# Patient Record
Sex: Female | Born: 1980
Health system: Southern US, Community
[De-identification: ages and names within clinical notes are randomized; demographics above are authoritative.]

## PROBLEM LIST (undated history)

## (undated) DIAGNOSIS — N200 Calculus of kidney: Secondary | ICD-10-CM

## (undated) DIAGNOSIS — I10 Essential (primary) hypertension: Secondary | ICD-10-CM

## (undated) HISTORY — PX: TUBAL LIGATION: SHX77

---

## 2003-11-14 ENCOUNTER — Emergency Department (HOSPITAL_COMMUNITY): Admission: EM | Admit: 2003-11-14 | Discharge: 2003-11-14 | Payer: Self-pay | Admitting: Emergency Medicine

## 2004-07-04 ENCOUNTER — Emergency Department (HOSPITAL_COMMUNITY): Admission: EM | Admit: 2004-07-04 | Discharge: 2004-07-04 | Payer: Self-pay | Admitting: Emergency Medicine

## 2004-07-08 ENCOUNTER — Emergency Department (HOSPITAL_COMMUNITY): Admission: EM | Admit: 2004-07-08 | Discharge: 2004-07-09 | Payer: Self-pay | Admitting: Emergency Medicine

## 2004-07-10 ENCOUNTER — Inpatient Hospital Stay (HOSPITAL_COMMUNITY): Admission: AD | Admit: 2004-07-10 | Discharge: 2004-07-10 | Payer: Self-pay | Admitting: *Deleted

## 2004-07-20 ENCOUNTER — Emergency Department (HOSPITAL_COMMUNITY): Admission: EM | Admit: 2004-07-20 | Discharge: 2004-07-20 | Payer: Self-pay | Admitting: Emergency Medicine

## 2004-08-21 ENCOUNTER — Inpatient Hospital Stay (HOSPITAL_COMMUNITY): Admission: AD | Admit: 2004-08-21 | Discharge: 2004-08-21 | Payer: Self-pay | Admitting: Obstetrics & Gynecology

## 2004-10-14 ENCOUNTER — Other Ambulatory Visit: Admission: RE | Admit: 2004-10-14 | Discharge: 2004-10-14 | Payer: Self-pay | Admitting: Obstetrics and Gynecology

## 2005-01-24 ENCOUNTER — Inpatient Hospital Stay (HOSPITAL_COMMUNITY): Admission: AD | Admit: 2005-01-24 | Discharge: 2005-01-24 | Payer: Self-pay | Admitting: Obstetrics and Gynecology

## 2005-03-04 ENCOUNTER — Inpatient Hospital Stay (HOSPITAL_COMMUNITY): Admission: AD | Admit: 2005-03-04 | Discharge: 2005-03-07 | Payer: Self-pay | Admitting: Obstetrics and Gynecology

## 2005-04-14 ENCOUNTER — Ambulatory Visit (HOSPITAL_COMMUNITY): Admission: RE | Admit: 2005-04-14 | Discharge: 2005-04-14 | Payer: Self-pay | Admitting: Obstetrics & Gynecology

## 2006-03-14 ENCOUNTER — Emergency Department (HOSPITAL_COMMUNITY): Admission: EM | Admit: 2006-03-14 | Discharge: 2006-03-14 | Payer: Self-pay | Admitting: Emergency Medicine

## 2006-10-30 ENCOUNTER — Emergency Department (HOSPITAL_COMMUNITY): Admission: EM | Admit: 2006-10-30 | Discharge: 2006-10-30 | Payer: Self-pay | Admitting: Emergency Medicine

## 2007-02-17 IMAGING — US US OB COMP LESS 14 WK
1 series · 13 of 28 positions shown · non-contrast
Comparison: 07/04/04.

CLINICAL DATA: Early pregnancy.  
 TRANSABDOMINAL AND TRANSVAGINAL FIRST TRIMESTER OB ULTRASOUND, 07/08/04:

[Series 1: unknown · 0.25mm/px · 13 of 90 slices shown]
[im 4/90]
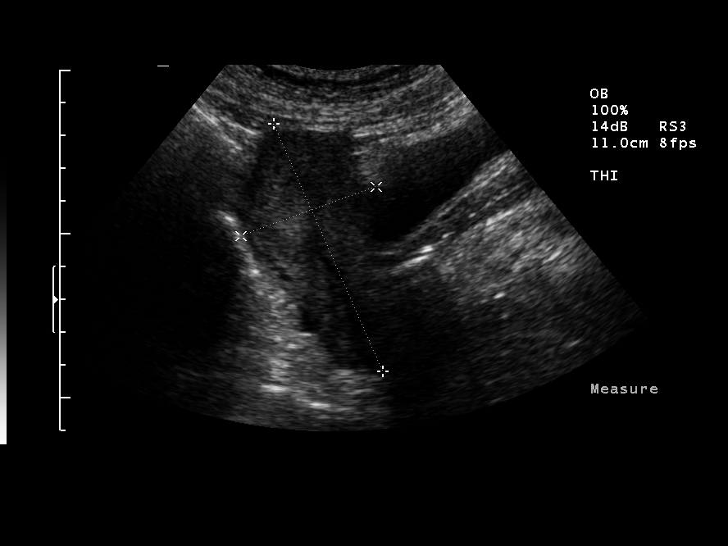
[im 10/90]
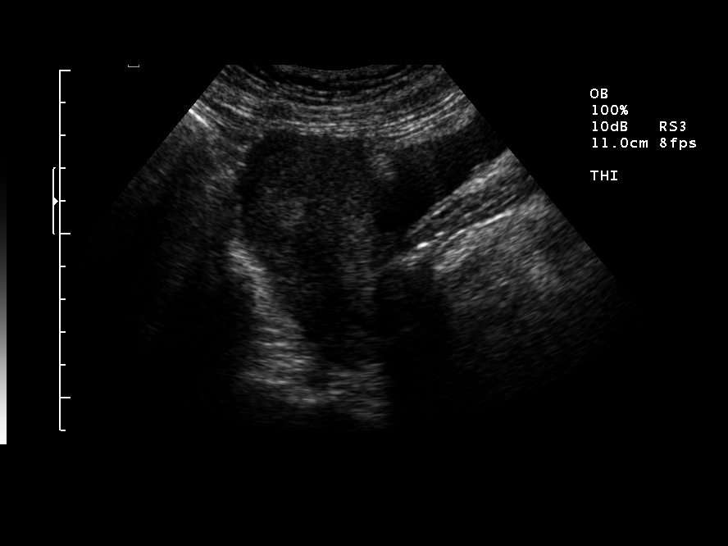
[im 17/90]
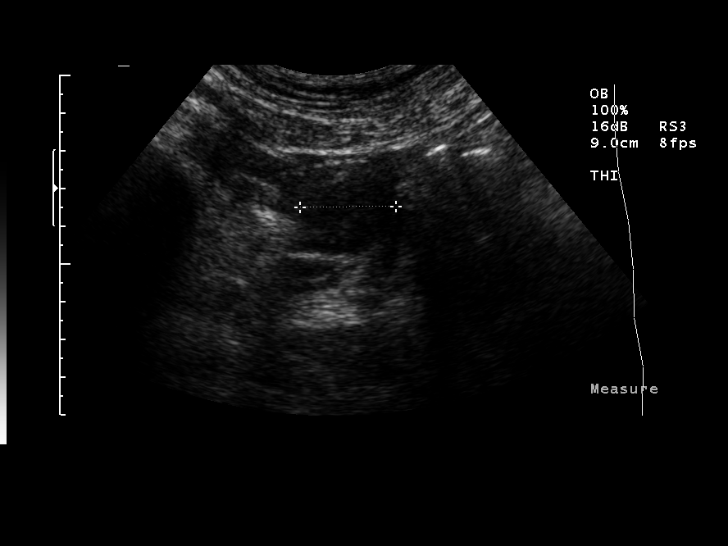
[im 24/90]
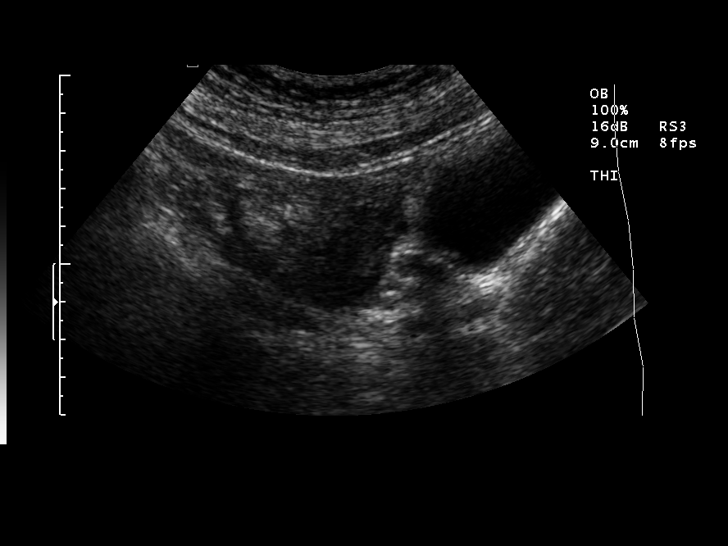
[im 30/90]
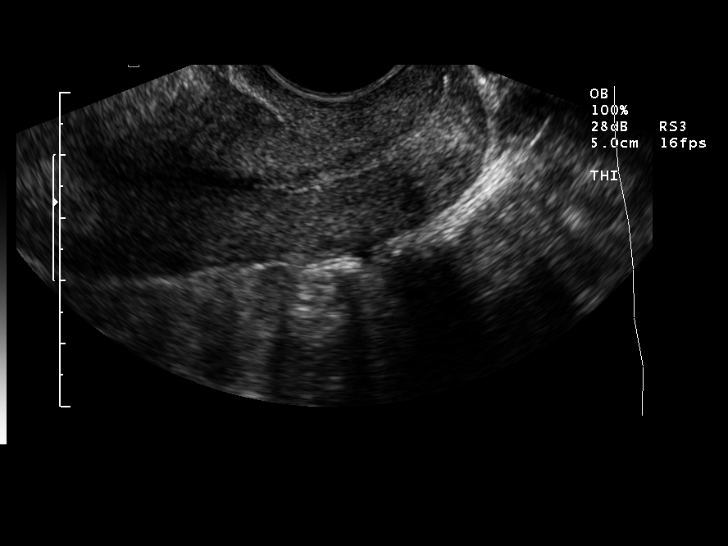
[im 37/90]
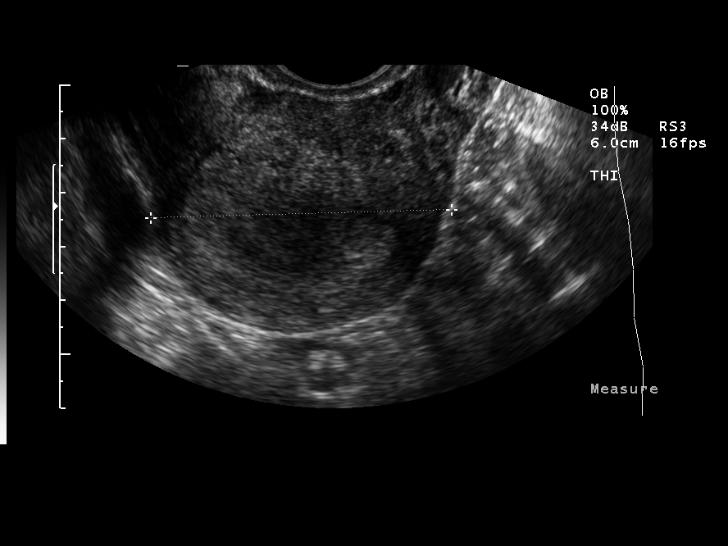
[im 47/90]
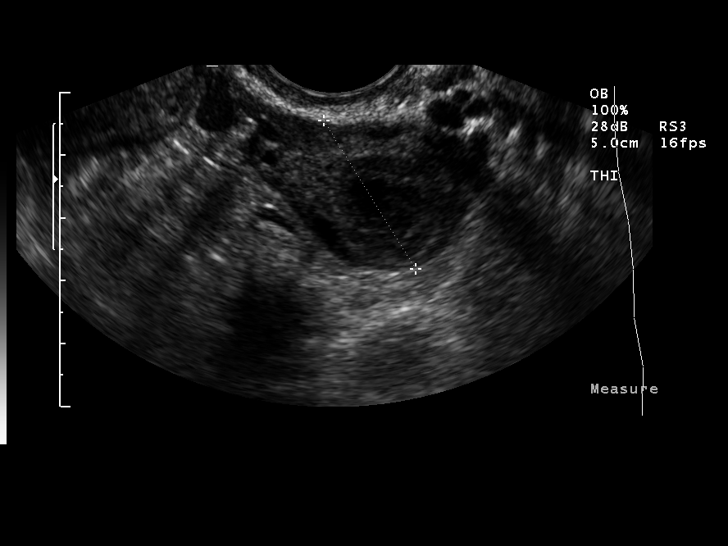
[im 53/90]
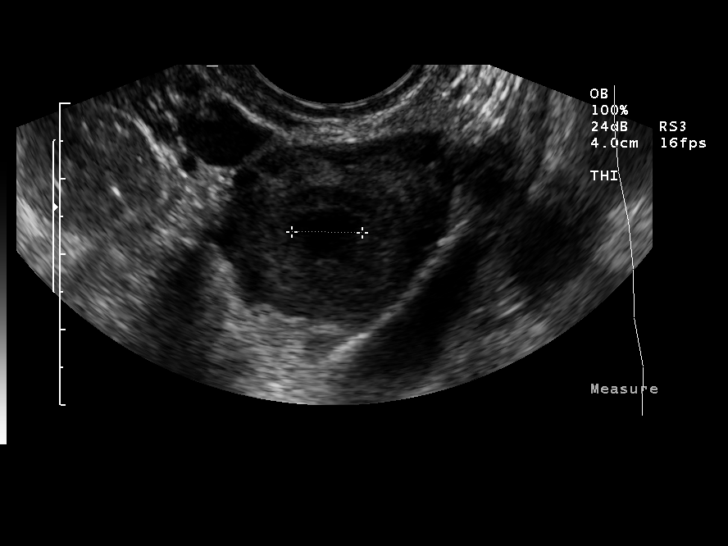
[im 60/90]
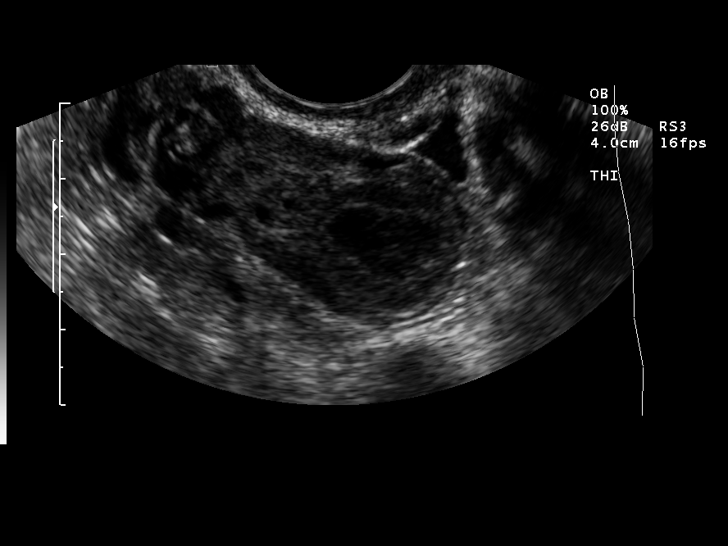
[im 66/90]
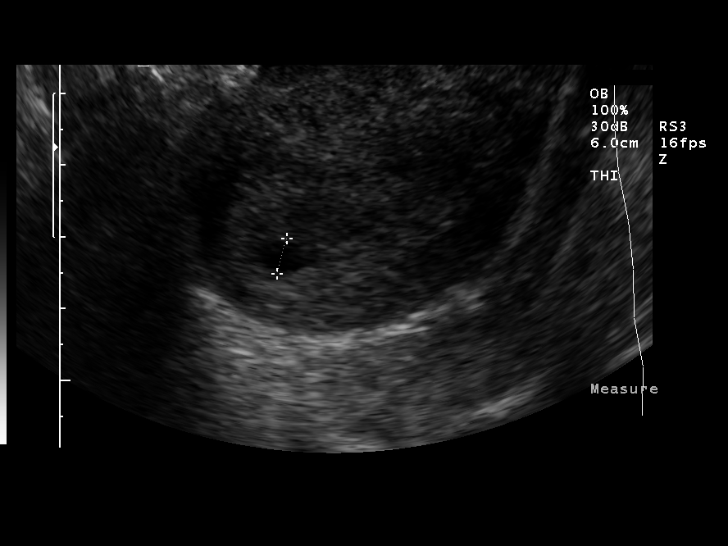
[im 73/90]
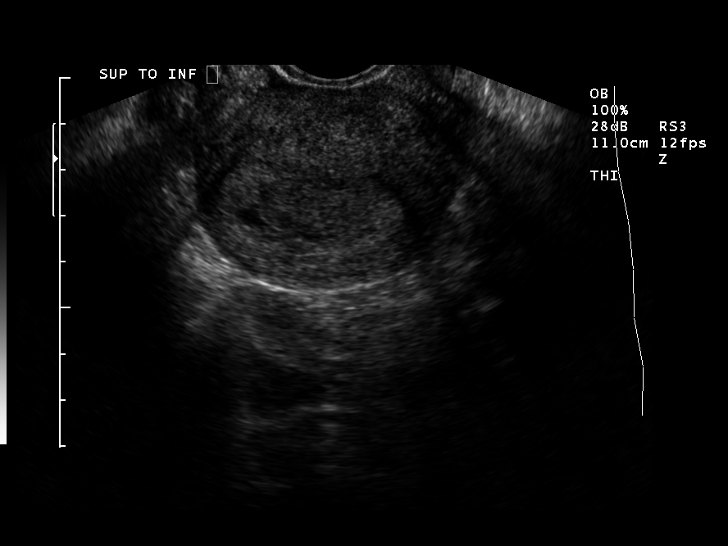
[im 80/90]
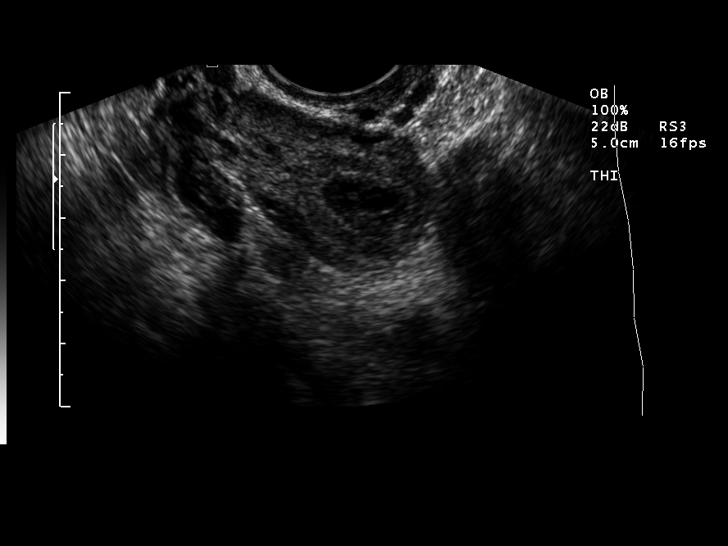
[im 86/90]
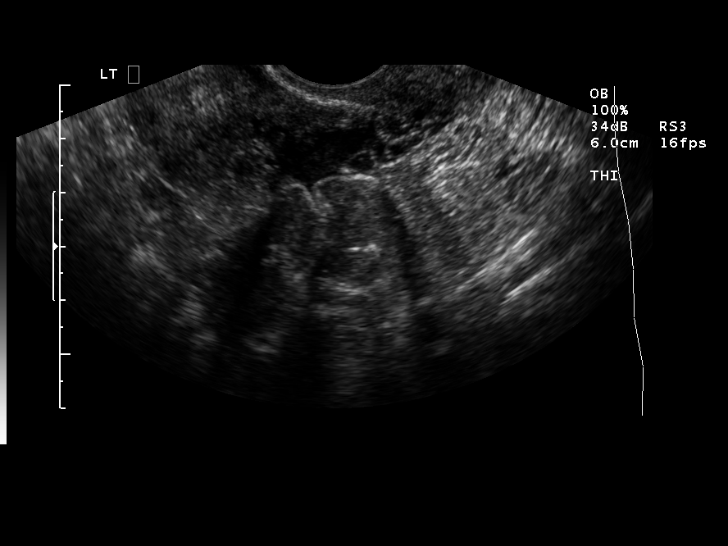

[13 of 28 positions shown; findings below may reference images not displayed]

FINDINGS: Uterine length is 8.2 cm.  There is a small amount of free pelvic fluid. 
 A cystic region in the left ovary measures 1.3 x 0.8 x 1.2 cm.  
 There appears to be a double decidual reaction in the uterus.  There is also a small sac in the distal uterus with mean sac diameter of 5 mm, consistent with 5 weeks gestation if this indeed represents a gestational sac.  No yolk sac or embryo is evident.  No fetal cardiac activity is yet evident.  No subchorionic hemorrhage is noted.  
 The right ovary appears normal. 
 No typical findings of ectopic pregnancy are identified.  The left-sided complex cystic lesion likely represents a corpus luteum cyst.
IMPRESSION: There is a small sac on today's examination, which could represent an early gestational sac.  No yolk sac or embryo is yet identified.  Differential diagnostic considerations include early pregnancy, anembryonic pregnancy, or pseudogestational sac in a setting of otherwise occult ectopic pregnancy.  Recommend careful correlation with beta hCG trend and a low threshold for reimaging.

## 2007-02-19 IMAGING — US US OB TRANSVAGINAL
1 series · 18 of 22 positions shown · non-contrast
Comparison: none

CLINICAL DATA: 5 week 5 day gestational age by LMP.  Quantitative beta HCG level of 7122.  Question early gestational sac versus pseudosac on prior ultrasound.  Evaluate pregnancy progression and rule out ectopic pregnancy.
 TRANSVAGINAL OBSTETRICAL ULTRASOUND:
 Transvaginal sonography was performed and comparison made to the prior study performed at [HOSPITAL] on 07/08/04.  
 An intrauterine gestational sac is seen on today?s study containing a yolk sac.  Mean sac diameter is 7 mm, corresponding to a gestational age of  5 weeks 2 days.  This shows appropriate pregnancy progression since the prior study.  
 A moderate subchorionic hemorrhage is seen along the right lateral aspect of the gestational sac which has increased in size.  No fibroids or other uterine abnormalities are identified.  
 A hemorrhagic corpus luteum is again noted within the left ovary which measures approximately 2 cm.  The right ovary is normal in appearance.  There is no evidence of other adnexal masses or free fluid.

[Series 1: us ob transvaginal · 18 of 22 slices shown]
[im 1/22]
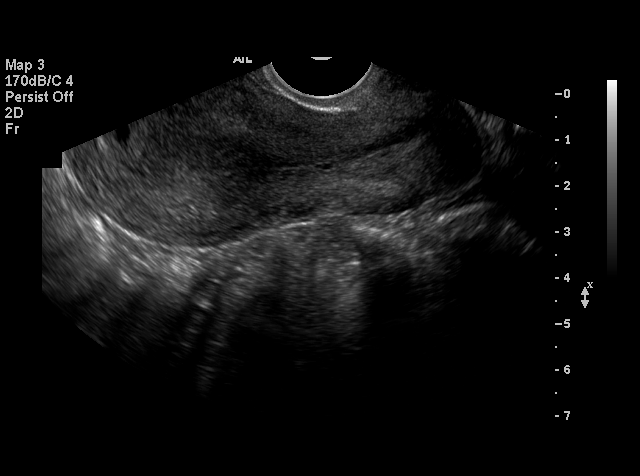
[im 2/22]
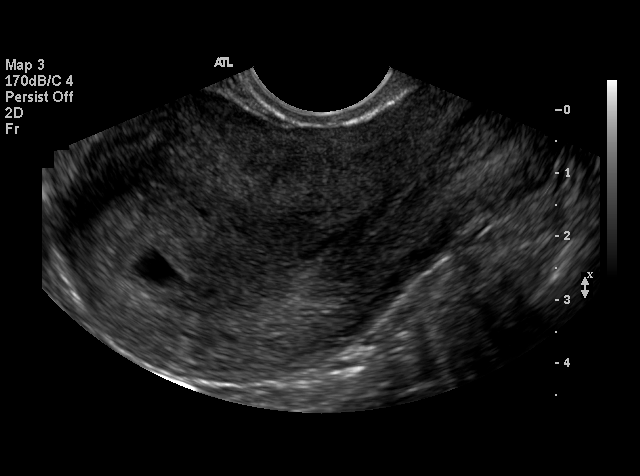
[im 4/22]
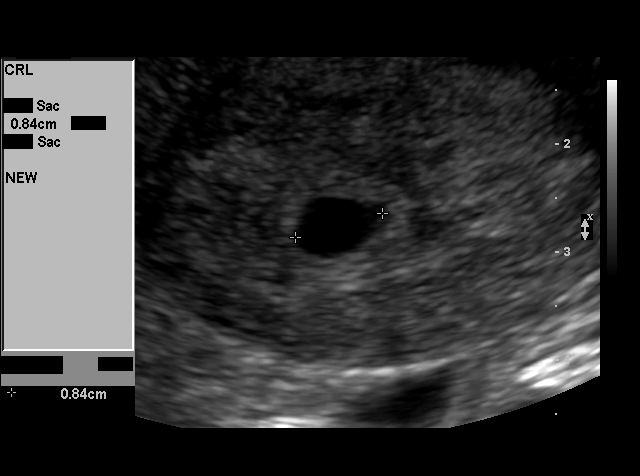
[im 5/22]
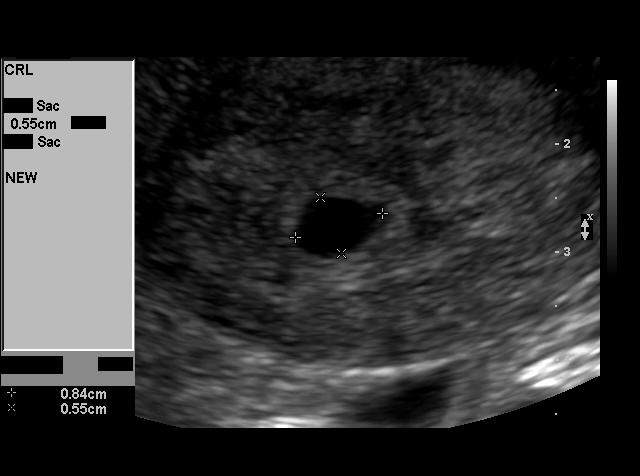
[im 6/22]
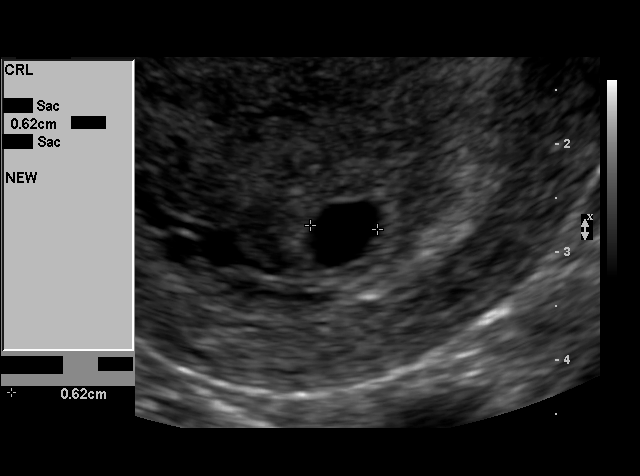
[im 7/22]
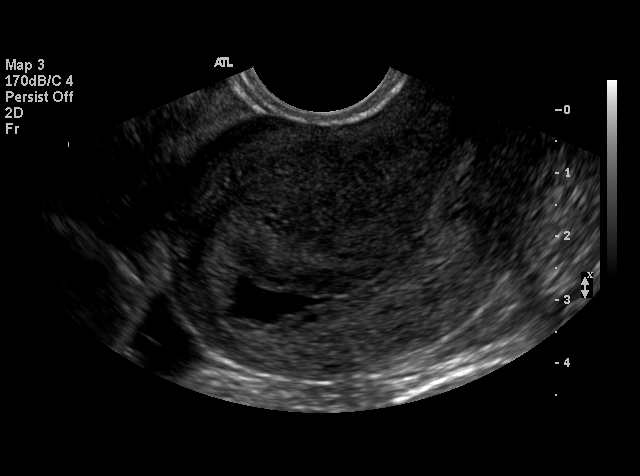
[im 8/22]
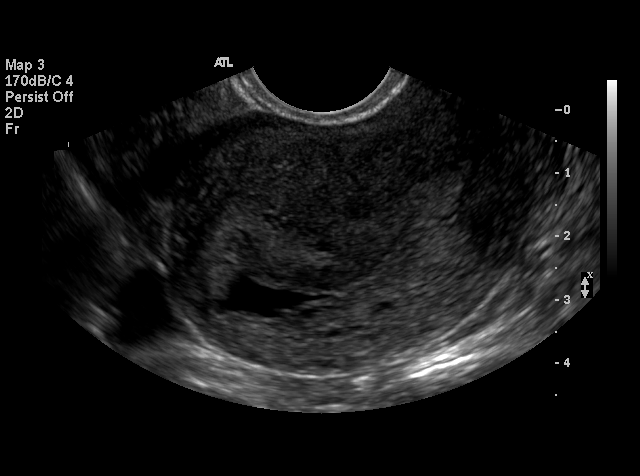
[im 10/22]
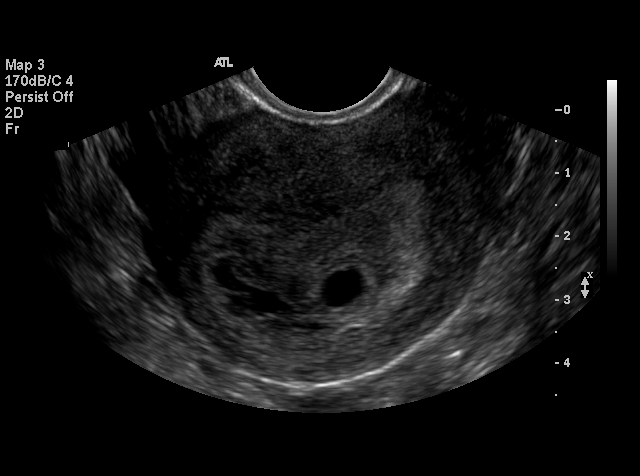
[im 11/22]
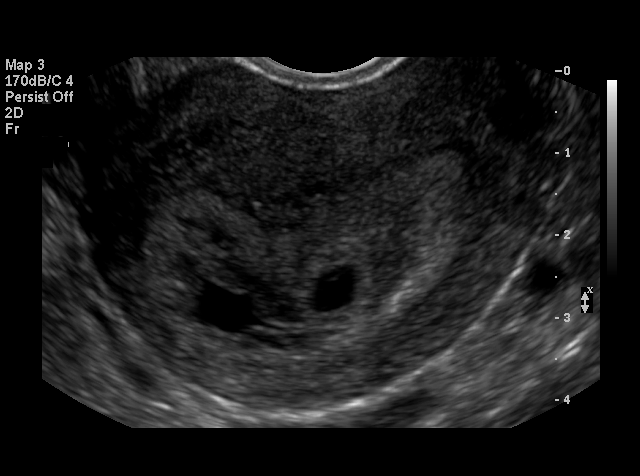
[im 12/22]
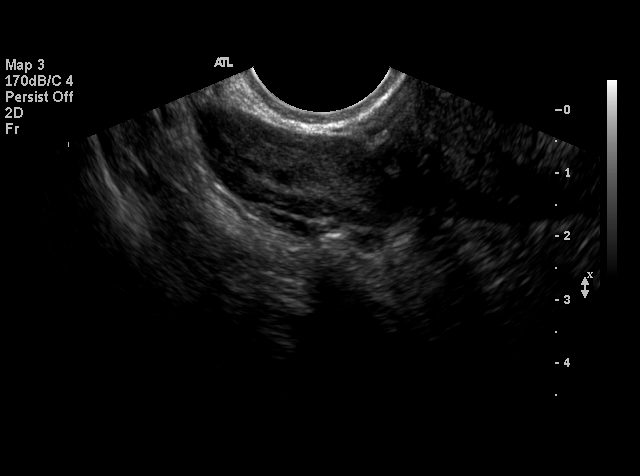
[im 13/22]
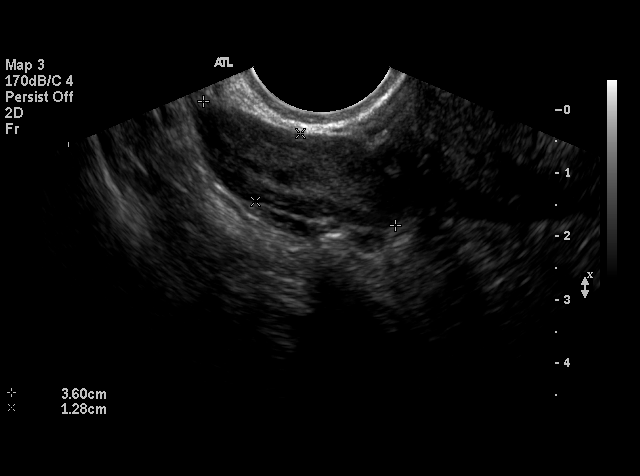
[im 15/22]
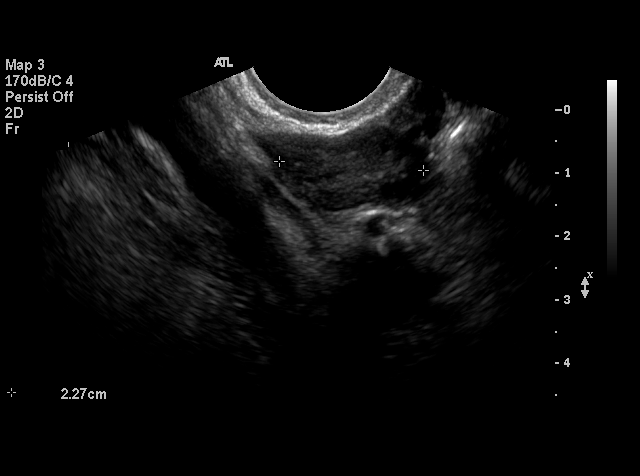
[im 16/22]
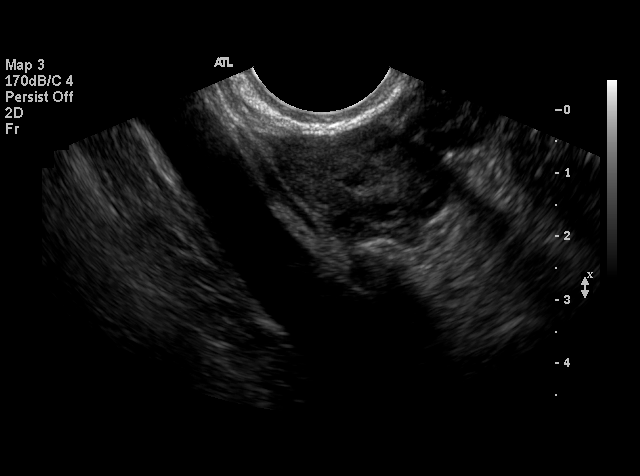
[im 17/22]
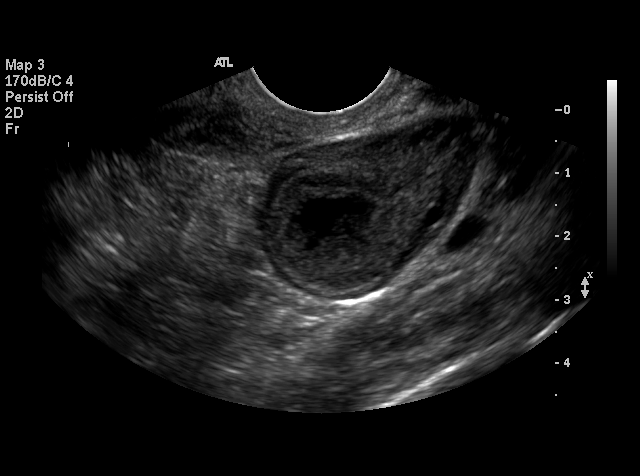
[im 18/22]
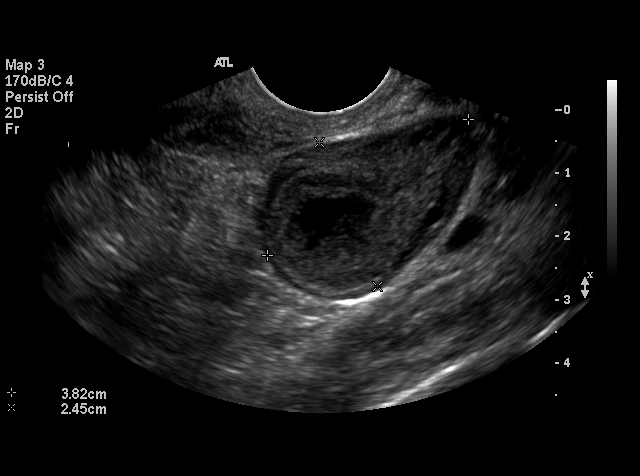
[im 19/22]
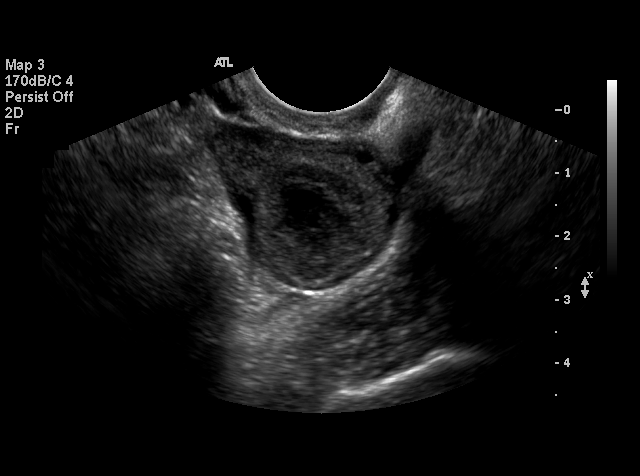
[im 21/22]
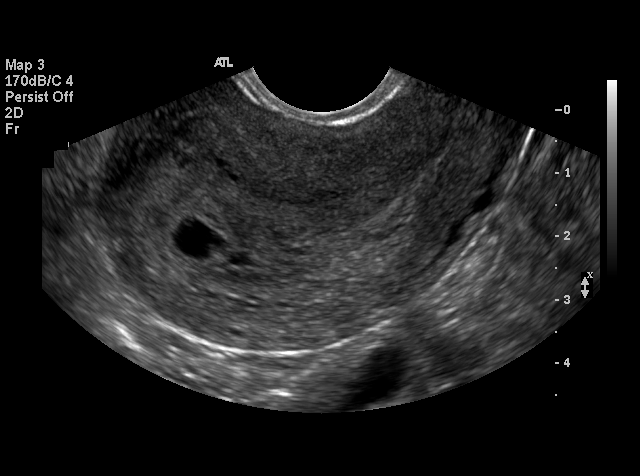
[im 22/22]
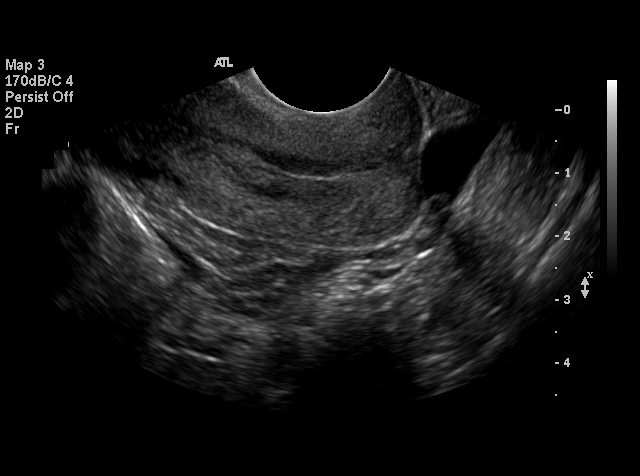

[18 of 22 positions shown; findings below may reference images not displayed]

IMPRESSION: 1.  Single intrauterine gestational sac with estimated gestational age of 5 weeks 2 days by mean sac diameter.  This shows appropriate pregnancy progression since the prior study.
 2.  2 cm hemorrhagic right ovarian corpus luteum.  No evidence of adnexal mass or free fluid.

## 2007-03-23 ENCOUNTER — Emergency Department (HOSPITAL_COMMUNITY): Admission: EM | Admit: 2007-03-23 | Discharge: 2007-03-23 | Payer: Self-pay | Admitting: Emergency Medicine

## 2007-08-20 ENCOUNTER — Inpatient Hospital Stay (HOSPITAL_COMMUNITY): Admission: EM | Admit: 2007-08-20 | Discharge: 2007-08-21 | Payer: Self-pay | Admitting: Emergency Medicine

## 2007-10-19 ENCOUNTER — Encounter: Admission: RE | Admit: 2007-10-19 | Discharge: 2007-11-17 | Payer: Self-pay | Admitting: Orthopedic Surgery

## 2009-06-25 ENCOUNTER — Emergency Department (HOSPITAL_COMMUNITY): Admission: EM | Admit: 2009-06-25 | Discharge: 2009-06-25 | Payer: Self-pay | Admitting: Emergency Medicine

## 2010-05-06 ENCOUNTER — Emergency Department (HOSPITAL_COMMUNITY)
Admission: EM | Admit: 2010-05-06 | Discharge: 2010-05-06 | Payer: Self-pay | Source: Home / Self Care | Admitting: Emergency Medicine

## 2010-07-21 LAB — POCT I-STAT, CHEM 8
Hemoglobin: 13.3 g/dL (ref 12.0–15.0)
Sodium: 141 mEq/L (ref 135–145)
TCO2: 26 mmol/L (ref 0–100)

## 2010-09-23 NOTE — Op Note (Signed)
NAMEMARLAYA, [name]                 ACCOUNT NO.:  [id number]   MEDICAL RECORD NO.:  [id number]          PATIENT TYPE:  INP   LOCATION:  0105                         FACILITY:  [facility]   PHYSICIAN:  Dionne Ano. Gramig III, M.D.DATE OF BIRTH:  08-18-1980   DATE OF PROCEDURE:  08/20/2007  DATE OF DISCHARGE:                               OPERATIVE REPORT   PREOPERATIVE DIAGNOSIS:  Severe lacerating injury to the left forearm  with exposed tendon, nerve, muscle and soft tissue derangement.   POSTOPERATIVE DIAGNOSIS:  Severe lacerating injury to the left forearm  with exposed tendon, nerve, muscle and soft tissue derangement with  noted tendon laceration about the carpal canal, exposed median nerve,  significant musculotendinous damage and tight compartments.   SURGICAL PROCEDURES PERFORMED:  1. Irrigation and debridement left forearm skin, subcutaneous tissue,      tendon, muscle.  This was an excisional debridement.  2. Repair flexor carpi radialis left forearm/wrist region.  3. Repair flexor digitorum superficialis to the index finger.  4. Repair flexor digitorum superficialis to the middle finger.  5. Fasciotomy to the left forearm.  6. Open carpal tunnel release left forearm.  7. Palmaris longus tenotomy left forearm.  8. Median nerve neurolysis and exploration noted to be intact, left      forearm.  9. Ulnar nerve neurolysis and release, left forearm and wrist.  10.Complicated wound closure with advancement/rotational flap type      coverage given the V shaped defect from the lacerating injury.   SURGEON:  Dionne Ano. [name], M.D.   ASSISTANT:  None.   COMPLICATIONS:  None.   ANESTHESIA:  General.   TOURNIQUET TIME:  Less than 90 minutes.   INDICATIONS FOR PROCEDURE:  This patient is a 30 year old female who  presents with the above mentioned diagnosis.  I have counseled her in  regards to risks and benefits of surgery including risk of infection,  bleeding, anesthesia,  damage to normal structures and failure of surgery  to accomplish its intended goals of relieving symptoms and restoring  function.  With this in mind she desires to proceed.  All questions have  been encouraged and answered preoperatively.   PROCEDURE IN DETAIL:  The patient was identified by myself and  anesthesia, permit was signed and/or marked.  H and P consent performed  and the patient was then taken to the operative suite and underwent a  smooth induction of general anesthesia.  She was laid supine and fully  padded and prepped and draped in the usual sterile fashion with Betadine  scrub and paint.  The tourniquet was inflated during this due to the  significant hemorrhage from the wound.  This was a very jagged  laceration.  She had a V shaped degloving type component with the skin  base proximally.  Once the sterile field was secured the patient  underwent an I and D, irrigation of skin, subcutaneous tissue, muscle  and tendon tissue was performed with greater than 3 liters of solution  placed through the arm.  The patient tolerated this well and following  this I then  performed a thorough exploration of the area.   The patient had a fasciotomy performed about the superficial and deep  flexor apparatus.  The fasciotomy was accomplished without difficulty  about the forearm and relieved tight muscle tension in the area.   Once this was done I then performed an open carpal tunnel release given  the area of the median nerve, its location in the wound and obvious  swelling issues that will be encountered postoperatively.  This was done  without difficulty releasing the transverse carpal ligament.  I made  additional incision into the palmar region, taking care not to cross the  wrist crease with right angles.  This was done without difficulty and  following this a complete release was accomplished, preserving the  superficial palmar arch and making sure that the median nerve was   carefully protected.   Following this the median nerve underwent a thorough and thoughtful  exploration.  The patient had barely missed the nerve and a neurolysis  of the nerve noted that the palmar cutaneous branch of the median nerve  as well as the nerve itself was intact.  I was very pleased with this  and felt that the patient was extremely fortunate.   Following this the tendon injury about the palmaris longus, FCR and  superficialis tendons to the index and middle finger were of course  identified.   I mobilized skin flaps and following this performed an evaluation of the  ulnar nerve and artery.  The ulnar nerve was numb preoperatively likely  due to contusive injury and hematoma as it was noted to be intact on  neurolysis and exploration procedure.  Thus the ulnar nerve and artery  were intact.  I was pleased with the findings.   Following this I then performed repair of the flexor carpi radialis with  3-0 FiberWire suture in an 8-strand technique.  Following this I  reformed flexor digitorum superficialis repair to the index finger with  3-0 FiberWire suture.  A 4-strand repair was used.  Following this a  flexor digitorum superficialis repair to the middle finger was  accomplished utilizing a 4-strand repair technique of 3-0 FiberWire.   Following this I then performed a palmaris longus tenotomy.  I felt that  repair of the palmaris would be ill-advised as it would be directly over  the median nerve and contribute to scarring, pain and problems.  As this  is a very expendable tendon and one that is not present in 20% of the  adult population I performed a tenotomy.   Following this I irrigated copiously once again with multiple liters of  fluid and then deflated the tourniquet and obtained hemostasis and  performed a complex wound closure with rotation flap based upon the  proximal V shaped skin avulsion.  This was inset nicely and advanced  into the area of defect.  The  skin edges were mobilized distally in a V-  Y type closure fashion as well.  The patient tolerated this without  difficulty and following this I was pleased with the soft compartments,  nice closure and pink refill to the fingers without problems.  I placed  20 mL of Sensorcaine 0.25% without epinephrine in the area and a medium  Hemovac drain was hooked up to suction prior to wound closure I should  note.  The patient tolerated this well.  There were no complicating  features.   She was taken to the recovery room in stable condition and all  sponge,  needle and instrument counts were reported as correct.  The patient will  be monitored overnight.  We will plan for IV antibiotics, pain medicine  according to her needs and general postop observation.   When she returns to the office we are going to place her in a removable  splint and simply protect the FCR tendon for 4 weeks without forceful  flexion of the wrist, keep her in neutral position and work on finger  range of motion and monitor her condition closely.  It has been a  pleasure participating in her care and look forward to participating in  her operative recovery.           ______________________________  Dionne Ano. Everlene Other, M.D.     Nash Mantis  D:  08/20/2007  T:  08/20/2007  Job:  [id number]

## 2010-09-26 NOTE — Op Note (Signed)
[name], [name]                 ACCOUNT NO.:  [id number]   MEDICAL RECORD NO.:  [id number]          PATIENT TYPE:  AMB   LOCATION:  SDC                           FACILITY:  [facility]   PHYSICIAN:  Gerrit Friends. Aldona Bar, M.D.   DATE OF BIRTH:  Oct 03, 1980   DATE OF PROCEDURE:  04/14/2005  DATE OF DISCHARGE:                                 OPERATIVE REPORT   PREOPERATIVE DIAGNOSIS:  Desire for elective sterilization.   POSTOP DIAGNOSES:  Desire for elective sterilization.   PROCEDURE:  Laparoscopic tubal coagulation for permanent elective  sterilization.   SURGEON:  Gerrit Friends. Aldona Bar, M.D.   ANESTHESIA:  General endotracheal.   HISTORY:  This 30 year old gravida 4, para 3, abortus 1 delivered on October  25 and is now being taken to the operating room for permanent elective  sterilization procedure. She signed her Medicaid forms on 03/13/2005. She  understands the procedure is meant to be 100% permanent but unfortunately is  not necessarily 100% perfect and subsequent pregnancy can result. According  to her wishes. She is now being taken to the operating room for an elective  sterilization procedure. She denies having intercourse since before  delivery. Her cytology in June 2006 was within normal limits.   PROCEDURE:  The patient was taken to the operating room and after  satisfactory induction of general endotracheal anesthesia she was prepped  and draped having been placed in modified lithotomy position in short Allen  stirrups. She was prepped and draped in usual fashion after the bladder was  drained of clear urine with a red rubber catheter in in-and-out fashion and  a Hulka tenaculum attached to the cervix for uterine mobility during the  procedure.   At this time a 1 cm subumbilical midline transverse skin incision was made  and through this a large trocar and sleeve was introduced without  difficulty. A large trocar was removed and through the large sleeve, the  laparoscope was  introduced with good visualization of intra-abdominal  structures. At this time a pneumoperitoneum was created with approximately 3  liters of carbon dioxide gas. Inspection of the abdomen and pelvis was  totally normal - undersurface of diaphragm, liver edge, appendix, tubes,  ovaries and the uterus all appeared completely normal as did intestinal  contents.   At this time through a 5 mm suprapubic midline transverse skin incision,  accessory trocar and sleeve was introduced under direct visualization. The  accessory trocar was removed and through the accessory sleeve, bipolar  coagulator instrument was introduced after it had been appropriately tested.   At this time with minimal difficulty, the right fallopian tube was  identified, traced out to fimbriated end for positive identification, the  midportion of right fallopian tube, an area measuring approximately 2 cm was  adequately coagulated. Similar procedure was carried out on the left  fallopian tube. At this time with a segment of each fallopian tube  adequately coagulated and no pathology appreciated, procedure was felt to be  complete and was terminated. The bipolar coagulating instrument was removed  from accessory sleeve and accessory sleeve  removed under direct  visualization with the undersurface of the peritoneum being dry. At this  time the peritoneum was reduced. Large sleeve was removed. The closure of  skin incision was carried out using 4-0 Vicryl in subcuticular fashion. The  Band-aids were subsequently applied. At this time the Hulka tenaculum was  removed from the cervix. The patient was awakened and transported to  recovery room in satisfactory condition having tolerated the procedure well.  Estimated blood loss was negligible.  Pathologic specimen was none.  All  counts correct x2.   The patient will be recovered until felt able to be discharged. She will be  given all appropriate instructions on discharge as  well as prescriptions for  Tylox one to two every 4 to 6 hours as needed for severe pain and Phenergan  25 milligrams rectal suppositories to use one every 4-6 hours as needed for  severe nausea, vomiting. She will be asked to return to the office in  approximately 3 weeks for follow-up or as needed. Condition on arrival to  recovery was satisfactory.      Gerrit Friends. Aldona Bar, M.D.  Electronically Signed     RMW/MEDQ  D:  04/14/2005  T:  04/15/2005  Job:  [id number]

## 2011-01-09 ENCOUNTER — Emergency Department (HOSPITAL_COMMUNITY): Payer: Self-pay

## 2011-01-09 ENCOUNTER — Emergency Department (HOSPITAL_COMMUNITY)
Admission: EM | Admit: 2011-01-09 | Discharge: 2011-01-09 | Disposition: A | Discharge status: Home / Self Care | Payer: Self-pay | Attending: Emergency Medicine | Admitting: Emergency Medicine

## 2011-01-09 DIAGNOSIS — F411 Generalized anxiety disorder: Secondary | ICD-10-CM | POA: Insufficient documentation

## 2011-01-09 DIAGNOSIS — S301XXA Contusion of abdominal wall, initial encounter: Secondary | ICD-10-CM | POA: Insufficient documentation

## 2011-01-09 DIAGNOSIS — F319 Bipolar disorder, unspecified: Secondary | ICD-10-CM | POA: Insufficient documentation

## 2011-01-09 DIAGNOSIS — H5710 Ocular pain, unspecified eye: Secondary | ICD-10-CM | POA: Insufficient documentation

## 2011-01-09 DIAGNOSIS — M549 Dorsalgia, unspecified: Secondary | ICD-10-CM | POA: Insufficient documentation

## 2011-01-09 DIAGNOSIS — H538 Other visual disturbances: Secondary | ICD-10-CM | POA: Insufficient documentation

## 2011-01-09 DIAGNOSIS — R61 Generalized hyperhidrosis: Secondary | ICD-10-CM | POA: Insufficient documentation

## 2011-01-09 DIAGNOSIS — R55 Syncope and collapse: Secondary | ICD-10-CM | POA: Insufficient documentation

## 2011-01-09 DIAGNOSIS — X58XXXA Exposure to other specified factors, initial encounter: Secondary | ICD-10-CM | POA: Insufficient documentation

## 2011-01-09 DIAGNOSIS — R109 Unspecified abdominal pain: Secondary | ICD-10-CM | POA: Insufficient documentation

## 2011-01-09 DIAGNOSIS — R42 Dizziness and giddiness: Secondary | ICD-10-CM | POA: Insufficient documentation

## 2011-01-09 LAB — CBC
HCT: 39 % (ref 36.0–46.0)
Hemoglobin: 13.8 g/dL (ref 12.0–15.0)
MCH: 31.6 pg (ref 26.0–34.0)
MCHC: 35.4 g/dL (ref 30.0–36.0)
MCV: 89.2 fL (ref 78.0–100.0)
Platelets: 259 10*3/uL (ref 150–400)
RBC: 4.37 MIL/uL (ref 3.87–5.11)
RDW: 12.8 % (ref 11.5–15.5)
WBC: 13.8 10*3/uL — ABNORMAL HIGH (ref 4.0–10.5)

## 2011-01-09 LAB — BASIC METABOLIC PANEL WITH GFR
CO2: 25 meq/L (ref 19–32)
Calcium: 8.9 mg/dL (ref 8.4–10.5)
Chloride: 107 meq/L (ref 96–112)
Creatinine, Ser: 0.66 mg/dL (ref 0.50–1.10)
Glucose, Bld: 81 mg/dL (ref 70–99)

## 2011-01-09 LAB — URINALYSIS, ROUTINE W REFLEX MICROSCOPIC
Bilirubin Urine: NEGATIVE
Glucose, UA: NEGATIVE mg/dL
Hgb urine dipstick: NEGATIVE
Ketones, ur: NEGATIVE mg/dL
Leukocytes, UA: NEGATIVE
Nitrite: NEGATIVE
Protein, ur: NEGATIVE mg/dL
Specific Gravity, Urine: 1.009 (ref 1.005–1.030)
Urobilinogen, UA: 0.2 mg/dL (ref 0.0–1.0)
pH: 6 (ref 5.0–8.0)

## 2011-01-09 LAB — BASIC METABOLIC PANEL
BUN: 4 mg/dL — ABNORMAL LOW (ref 6–23)
GFR calc Af Amer: >60 mL/min (ref >60)
GFR calc non Af Amer: >60 mL/min (ref >60)
Potassium: 3.6 mEq/L (ref 3.5–5.1)
Sodium: 138 mEq/L (ref 135–145)

## 2011-01-09 LAB — POCT PREGNANCY, URINE: Preg Test, Ur: NEGATIVE

## 2011-01-19 ENCOUNTER — Emergency Department (HOSPITAL_COMMUNITY)
Admission: EM | Admit: 2011-01-19 | Discharge: 2011-01-19 | Disposition: A | Discharge status: Home / Self Care | Payer: Self-pay | Attending: Emergency Medicine | Admitting: Emergency Medicine

## 2011-01-19 ENCOUNTER — Emergency Department (HOSPITAL_COMMUNITY): Payer: Self-pay

## 2011-01-19 DIAGNOSIS — S93409A Sprain of unspecified ligament of unspecified ankle, initial encounter: Secondary | ICD-10-CM | POA: Insufficient documentation

## 2011-01-19 DIAGNOSIS — S8990XA Unspecified injury of unspecified lower leg, initial encounter: Secondary | ICD-10-CM | POA: Insufficient documentation

## 2011-01-19 DIAGNOSIS — X500XXA Overexertion from strenuous movement or load, initial encounter: Secondary | ICD-10-CM | POA: Insufficient documentation

## 2011-01-19 DIAGNOSIS — F319 Bipolar disorder, unspecified: Secondary | ICD-10-CM | POA: Insufficient documentation

## 2011-01-19 DIAGNOSIS — M25579 Pain in unspecified ankle and joints of unspecified foot: Secondary | ICD-10-CM | POA: Insufficient documentation

## 2011-02-03 LAB — DIFFERENTIAL
Eosinophils Relative: 4
Lymphocytes Relative: 36
Lymphs Abs: 3.3
Neutro Abs: 4.8
Neutrophils Relative %: 53

## 2011-02-03 LAB — CBC
HCT: 38.8
Platelets: 259
WBC: 9.1

## 2011-02-03 LAB — BASIC METABOLIC PANEL
BUN: 5 — ABNORMAL LOW
GFR calc non Af Amer: >60
Potassium: 3.9

## 2011-02-03 LAB — HEPATITIS B SURFACE ANTIGEN: Hepatitis B Surface Ag: NEGATIVE

## 2011-02-03 LAB — HIV RAPID SCREEN (BLD OR BODY FLD EXPOSURE)

## 2011-02-25 LAB — URINE MICROSCOPIC-ADD ON

## 2011-02-25 LAB — URINALYSIS, ROUTINE W REFLEX MICROSCOPIC
Bilirubin Urine: NEGATIVE
Glucose, UA: NEGATIVE
Specific Gravity, Urine: 1.015
pH: 7.5

## 2011-02-25 LAB — CBC
RBC: 4.19
WBC: 13.4 — ABNORMAL HIGH

## 2011-04-12 ENCOUNTER — Emergency Department (HOSPITAL_COMMUNITY)
Admission: EM | Admit: 2011-04-12 | Discharge: 2011-04-13 | Disposition: A | Discharge status: Home / Self Care | Payer: No Typology Code available for payment source | Attending: Emergency Medicine | Admitting: Emergency Medicine

## 2011-04-12 ENCOUNTER — Encounter: Payer: Self-pay | Admitting: *Deleted

## 2011-04-12 DIAGNOSIS — M545 Low back pain, unspecified: Secondary | ICD-10-CM | POA: Insufficient documentation

## 2011-04-12 DIAGNOSIS — M549 Dorsalgia, unspecified: Secondary | ICD-10-CM

## 2011-04-12 DIAGNOSIS — F17200 Nicotine dependence, unspecified, uncomplicated: Secondary | ICD-10-CM | POA: Insufficient documentation

## 2011-04-12 DIAGNOSIS — R0789 Other chest pain: Secondary | ICD-10-CM | POA: Insufficient documentation
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
## 2011-04-13 ENCOUNTER — Emergency Department (HOSPITAL_COMMUNITY): Payer: No Typology Code available for payment source

## 2011-04-13 MED ORDER — HYDROCODONE-ACETAMINOPHEN 5-325 MG PO TABS: 1.0000 | ORAL_TABLET | Freq: Four times a day (QID) | ORAL | Status: AC | PRN

## 2011-04-13 MED ORDER — OXYCODONE-ACETAMINOPHEN 5-325 MG PO TABS
1.0000 | ORAL_TABLET | Freq: Once | ORAL | Status: AC
  Administered 2011-04-13: 1 via ORAL
  Filled 2011-04-13: qty 1

## 2011-04-13 MED ORDER — CYCLOBENZAPRINE HCL 10 MG PO TABS
5.0000 mg | ORAL_TABLET | Freq: Once | ORAL | Status: AC
  Administered 2011-04-13: 5 mg via ORAL
  Filled 2011-04-13: qty 1

## 2011-04-13 MED ORDER — DIAZEPAM 5 MG PO TABS: 5.0000 mg | ORAL_TABLET | Freq: Three times a day (TID) | ORAL | Status: AC | PRN

## 2011-04-13 MED ORDER — HYDROMORPHONE HCL PF 2 MG/ML IJ SOLN
2.0000 mg | Freq: Once | INTRAMUSCULAR | Status: AC
  Administered 2011-04-13: 2 mg via INTRAMUSCULAR
  Filled 2011-04-13: qty 1

## 2011-04-13 MED ORDER — ONDANSETRON 4 MG PO TBDP
8.0000 mg | ORAL_TABLET | Freq: Once | ORAL | Status: AC
  Administered 2011-04-13: 8 mg via ORAL
  Filled 2011-04-13: qty 2

## 2011-04-13 MED ORDER — IBUPROFEN 800 MG PO TABS
800.0000 mg | ORAL_TABLET | Freq: Once | ORAL | Status: AC
  Administered 2011-04-13: 800 mg via ORAL
  Filled 2011-04-13: qty 1
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
## 2011-04-13 NOTE — ED Notes (Signed)
All jewelry and clothing removed
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
## 2012-09-20 ENCOUNTER — Encounter (HOSPITAL_COMMUNITY): Payer: Self-pay | Admitting: Emergency Medicine

## 2012-09-20 ENCOUNTER — Emergency Department (HOSPITAL_COMMUNITY): Payer: Medicaid Other

## 2012-09-20 ENCOUNTER — Emergency Department (HOSPITAL_COMMUNITY)
Admission: EM | Admit: 2012-09-20 | Discharge: 2012-09-20 | Disposition: A | Discharge status: Home / Self Care | Payer: Medicaid Other | Attending: Emergency Medicine | Admitting: Emergency Medicine

## 2012-09-20 DIAGNOSIS — S4980XA Other specified injuries of shoulder and upper arm, unspecified arm, initial encounter: Secondary | ICD-10-CM | POA: Insufficient documentation

## 2012-09-20 DIAGNOSIS — Y990 Civilian activity done for income or pay: Secondary | ICD-10-CM | POA: Insufficient documentation

## 2012-09-20 DIAGNOSIS — W1809XA Striking against other object with subsequent fall, initial encounter: Secondary | ICD-10-CM | POA: Insufficient documentation

## 2012-09-20 DIAGNOSIS — Y9289 Other specified places as the place of occurrence of the external cause: Secondary | ICD-10-CM | POA: Insufficient documentation

## 2012-09-20 DIAGNOSIS — Y9329 Activity, other involving ice and snow: Secondary | ICD-10-CM | POA: Insufficient documentation

## 2012-09-20 DIAGNOSIS — S46909A Unspecified injury of unspecified muscle, fascia and tendon at shoulder and upper arm level, unspecified arm, initial encounter: Secondary | ICD-10-CM | POA: Insufficient documentation

## 2012-09-20 DIAGNOSIS — F17200 Nicotine dependence, unspecified, uncomplicated: Secondary | ICD-10-CM | POA: Insufficient documentation

## 2012-09-20 DIAGNOSIS — M25511 Pain in right shoulder: Secondary | ICD-10-CM

## 2012-09-20 MED ORDER — IBUPROFEN 600 MG PO TABS: 600.0000 mg | ORAL_TABLET | Freq: Four times a day (QID) | ORAL | Status: DC | PRN

## 2012-09-20 MED ORDER — HYDROCODONE-ACETAMINOPHEN 5-325 MG PO TABS: 2.0000 | ORAL_TABLET | Freq: Four times a day (QID) | ORAL | Status: DC | PRN
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
## 2012-09-20 NOTE — ED Provider Notes (Signed)
Medical screening examination/treatment/procedure(s) were performed by non-physician practitioner and as supervising physician I was immediately available for consultation/collaboration.  Flint Melter, MD 09/20/12 [phone]
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
## 2014-12-13 ENCOUNTER — Encounter (HOSPITAL_COMMUNITY): Payer: Self-pay | Admitting: *Deleted

## 2014-12-13 ENCOUNTER — Emergency Department (HOSPITAL_COMMUNITY)
Admission: EM | Admit: 2014-12-13 | Discharge: 2014-12-13 | Disposition: A | Discharge status: Home / Self Care | Payer: 59 | Attending: Physician Assistant | Admitting: Physician Assistant

## 2014-12-13 DIAGNOSIS — Z720 Tobacco use: Secondary | ICD-10-CM | POA: Diagnosis not present

## 2014-12-13 DIAGNOSIS — X58XXXA Exposure to other specified factors, initial encounter: Secondary | ICD-10-CM | POA: Diagnosis not present

## 2014-12-13 DIAGNOSIS — Y929 Unspecified place or not applicable: Secondary | ICD-10-CM | POA: Insufficient documentation

## 2014-12-13 DIAGNOSIS — S99921A Unspecified injury of right foot, initial encounter: Secondary | ICD-10-CM | POA: Diagnosis present

## 2014-12-13 DIAGNOSIS — Y939 Activity, unspecified: Secondary | ICD-10-CM | POA: Insufficient documentation

## 2014-12-13 DIAGNOSIS — Y998 Other external cause status: Secondary | ICD-10-CM | POA: Diagnosis not present

## 2014-12-13 DIAGNOSIS — S96911A Strain of unspecified muscle and tendon at ankle and foot level, right foot, initial encounter: Secondary | ICD-10-CM | POA: Diagnosis not present

## 2014-12-13 DIAGNOSIS — M722 Plantar fascial fibromatosis: Secondary | ICD-10-CM

## 2014-12-13 MED ORDER — NAPROXEN 500 MG PO TABS: 500.0000 mg | ORAL_TABLET | Freq: Two times a day (BID) | ORAL | Status: DC

## 2014-12-13 MED ORDER — NAPROXEN 250 MG PO TABS
500.0000 mg | ORAL_TABLET | Freq: Once | ORAL | Status: AC
  Administered 2014-12-13: 500 mg via ORAL
  Filled 2014-12-13: qty 2

## 2014-12-13 NOTE — ED Notes (Signed)
Declined W/C at D/C and was escorted to lobby by RN. 

## 2014-12-13 NOTE — Discharge Instructions (Signed)
1. Medications: Naproxen, usual home medications 2. Treatment: rest, drink plenty of fluids, use ASO, stretching as discussed and as indicated below 3. Follow Up: Please followup with podiatry as soon as possible for discussion of your diagnoses and further evaluation after today's visit; please also follow up with your primary care physician   Plantar Fasciitis (Heel Spur Syndrome) with Rehab The plantar fascia is a fibrous, ligament-like, soft-tissue structure that spans the bottom of the foot. Plantar fasciitis is a condition that causes pain in the foot due to inflammation of the tissue. SYMPTOMS   Pain and tenderness on the underneath side of the foot.  Pain that worsens with standing or walking. CAUSES  Plantar fasciitis is caused by irritation and injury to the plantar fascia on the underneath side of the foot. Common mechanisms of injury include:  Direct trauma to bottom of the foot.  Damage to a small nerve that runs under the foot where the main fascia attaches to the heel bone.  Stress placed on the plantar fascia due to bone spurs. RISK INCREASES WITH:   Activities that place stress on the plantar fascia (running, jumping, pivoting, or cutting).  Poor strength and flexibility.  Improperly fitted shoes.  Tight calf muscles.  Flat feet.  Failure to warm-up properly before activity.  Obesity. PREVENTION  Warm up and stretch properly before activity.  Allow for adequate recovery between workouts.  Maintain physical fitness:  Strength, flexibility, and endurance.  Cardiovascular fitness.  Maintain a health body weight.  Avoid stress on the plantar fascia.  Wear properly fitted shoes, including arch supports for individuals who have flat feet. PROGNOSIS  If treated properly, then the symptoms of plantar fasciitis usually resolve without surgery. However, occasionally surgery is necessary. RELATED COMPLICATIONS   Recurrent symptoms that may result in a  chronic condition.  Problems of the lower back that are caused by compensating for the injury, such as limping.  Pain or weakness of the foot during push-off following surgery.  Chronic inflammation, scarring, and partial or complete fascia tear, occurring more often from repeated injections. TREATMENT  Treatment initially involves the use of ice and medication to help reduce pain and inflammation. The use of strengthening and stretching exercises may help reduce pain with activity, especially stretches of the Achilles tendon. These exercises may be performed at home or with a therapist. Your caregiver may recommend that you use heel cups of arch supports to help reduce stress on the plantar fascia. Occasionally, corticosteroid injections are given to reduce inflammation. If symptoms persist for greater than 6 months despite non-surgical (conservative), then surgery may be recommended.  MEDICATION   If pain medication is necessary, then nonsteroidal anti-inflammatory medications, such as aspirin and ibuprofen, or other minor pain relievers, such as acetaminophen, are often recommended.  Do not take pain medication within 7 days before surgery.  Prescription pain relievers may be given if deemed necessary by your caregiver. Use only as directed and only as much as you need.  Corticosteroid injections may be given by your caregiver. These injections should be reserved for the most serious cases, because they may only be given a certain number of times. HEAT AND COLD  Cold treatment (icing) relieves pain and reduces inflammation. Cold treatment should be applied for 10 to 15 minutes every 2 to 3 hours for inflammation and pain and immediately after any activity that aggravates your symptoms. Use ice packs or massage the area with a piece of ice (ice massage).  Heat treatment may  be used prior to performing the stretching and strengthening activities prescribed by your caregiver, physical  therapist, or athletic trainer. Use a heat pack or soak the injury in warm water. SEEK IMMEDIATE MEDICAL CARE IF:  Treatment seems to offer no benefit, or the condition worsens.  Any medications produce adverse side effects. EXERCISES RANGE OF MOTION (ROM) AND STRETCHING EXERCISES - Plantar Fasciitis (Heel Spur Syndrome) These exercises may help you when beginning to rehabilitate your injury. Your symptoms may resolve with or without further involvement from your physician, physical therapist or athletic trainer. While completing these exercises, remember:   Restoring tissue flexibility helps normal motion to return to the joints. This allows healthier, less painful movement and activity.  An effective stretch should be held for at least 30 seconds.  A stretch should never be painful. You should only feel a gentle lengthening or release in the stretched tissue. RANGE OF MOTION - Toe Extension, Flexion  Sit with your right / left leg crossed over your opposite knee.  Grasp your toes and gently pull them back toward the top of your foot. You should feel a stretch on the bottom of your toes and/or foot.  Hold this stretch for __________ seconds.  Now, gently pull your toes toward the bottom of your foot. You should feel a stretch on the top of your toes and or foot.  Hold this stretch for __________ seconds. Repeat __________ times. Complete this stretch __________ times per day.  RANGE OF MOTION - Ankle Dorsiflexion, Active Assisted  Remove shoes and sit on a chair that is preferably not on a carpeted surface.  Place right / left foot under knee. Extend your opposite leg for support.  Keeping your heel down, slide your right / left foot back toward the chair until you feel a stretch at your ankle or calf. If you do not feel a stretch, slide your bottom forward to the edge of the chair, while still keeping your heel down.  Hold this stretch for __________ seconds. Repeat __________  times. Complete this stretch __________ times per day.  STRETCH - Gastroc, Standing  Place hands on wall.  Extend right / left leg, keeping the front knee somewhat bent.  Slightly point your toes inward on your back foot.  Keeping your right / left heel on the floor and your knee straight, shift your weight toward the wall, not allowing your back to arch.  You should feel a gentle stretch in the right / left calf. Hold this position for __________ seconds. Repeat __________ times. Complete this stretch __________ times per day. STRETCH - Soleus, Standing  Place hands on wall.  Extend right / left leg, keeping the other knee somewhat bent.  Slightly point your toes inward on your back foot.  Keep your right / left heel on the floor, bend your back knee, and slightly shift your weight over the back leg so that you feel a gentle stretch deep in your back calf.  Hold this position for __________ seconds. Repeat __________ times. Complete this stretch __________ times per day. STRETCH - Gastrocsoleus, Standing  Note: This exercise can place a lot of stress on your foot and ankle. Please complete this exercise only if specifically instructed by your caregiver.   Place the ball of your right / left foot on a step, keeping your other foot firmly on the same step.  Hold on to the wall or a rail for balance.  Slowly lift your other foot, allowing your body  weight to press your heel down over the edge of the step.  You should feel a stretch in your right / left calf.  Hold this position for __________ seconds.  Repeat this exercise with a slight bend in your right / left knee. Repeat __________ times. Complete this stretch __________ times per day.  STRENGTHENING EXERCISES - Plantar Fasciitis (Heel Spur Syndrome)  These exercises may help you when beginning to rehabilitate your injury. They may resolve your symptoms with or without further involvement from your physician, physical  therapist or athletic trainer. While completing these exercises, remember:   Muscles can gain both the endurance and the strength needed for everyday activities through controlled exercises.  Complete these exercises as instructed by your physician, physical therapist or athletic trainer. Progress the resistance and repetitions only as guided. STRENGTH - Towel Curls  Sit in a chair positioned on a non-carpeted surface.  Place your foot on a towel, keeping your heel on the floor.  Pull the towel toward your heel by only curling your toes. Keep your heel on the floor.  If instructed by your physician, physical therapist or athletic trainer, add ____________________ at the end of the towel. Repeat __________ times. Complete this exercise __________ times per day. STRENGTH - Ankle Inversion  Secure one end of a rubber exercise band/tubing to a fixed object (table, pole). Loop the other end around your foot just before your toes.  Place your fists between your knees. This will focus your strengthening at your ankle.  Slowly, pull your big toe up and in, making sure the band/tubing is positioned to resist the entire motion.  Hold this position for __________ seconds.  Have your muscles resist the band/tubing as it slowly pulls your foot back to the starting position. Repeat __________ times. Complete this exercises __________ times per day.  Document Released: 04/27/2005 Document Revised: 07/20/2011 Document Reviewed: 08/09/2008 [facility] Patient Information 2015 [place], [place]. This information is not intended to replace advice given to you by your health care provider. Make sure you discuss any questions you have with your health care provider.

## 2014-12-13 NOTE — ED Provider Notes (Signed)
CSN: [id number]     Arrival date & time 12/13/14  0815 History   First MD Initiated Contact with Patient 12/13/14 [phone]     Chief Complaint  Patient presents with  . Foot Pain     (Consider location/radiation/quality/duration/timing/severity/associated sxs/prior Treatment) Patient is a 34 y.o. female presenting with lower extremity pain. The history is provided by the patient and medical records. No language interpreter was used.  Foot Pain Associated symptoms include arthralgias. Pertinent negatives include no chills, fever, joint swelling, nausea, neck pain, numbness or vomiting.     [name] is a 34 y.o. female  with a hx of hypertension, non-insulin-dependent diabetes presents to the Emergency Department complaining of gradual, persistent, progressively worsening bilateral plantar foot pain onset several months ago. Patient reports that pain began in her right heel, progressed to bilateral heels and across the plantar surface of her bilateral feet. She reports that during this time she was attempting to work 2 jobs. She states she has done some exercises for plantar fasciitis including using a frozen water bottle. She reports she does stretching every morning which improves her pain but ultimately some pain persists. Patient reports altered gait due to pain and now is complaining of right lateral foot and ankle pain. She denies falls or known injury.  She denies swelling of her feet. She has tried ibuprofen and Tylenol with moderate relief. Walking and being on her feet for long periods of time makes symptoms worse. She denies fever, chills, nausea, vomiting, bruising or swelling of the foot, redness of the foot, numbness, weakness, tingling.    History reviewed. No pertinent past medical history. History reviewed. No pertinent past surgical history. History reviewed. No pertinent family history. History  Substance Use Topics  . Smoking status: Current Some Day Smoker -- 0.50 packs/day     Types: Cigarettes  . Smokeless tobacco: Not on file  . Alcohol Use: No   OB History    No data available     Review of Systems  Constitutional: Negative for fever and chills.  Gastrointestinal: Negative for nausea and vomiting.  Musculoskeletal: Positive for arthralgias and gait problem. Negative for back pain, joint swelling, neck pain and neck stiffness.  Skin: Negative for wound.  Neurological: Negative for numbness.  Hematological: Does not bruise/bleed easily.  Psychiatric/Behavioral: The patient is not nervous/anxious.   All other systems reviewed and are negative.     Allergies  Review of patient's allergies indicates no known allergies.  Home Medications   Prior to Admission medications   Medication Sig Start Date End Date Taking? Authorizing Provider  acetaminophen (TYLENOL) 650 MG CR tablet Take 650 mg by mouth every 8 (eight) hours as needed for pain.    Historical Provider, MD  HYDROcodone-acetaminophen (NORCO/VICODIN) 5-325 MG per tablet Take 2 tablets by mouth every 6 (six) hours as needed for pain. 09/20/12   Roxy Horseman, PA-C  ibuprofen (ADVIL,MOTRIN) 600 MG tablet Take 1 tablet (600 mg total) by mouth every 6 (six) hours as needed for pain. 09/20/12   Roxy Horseman, PA-C  naproxen (NAPROSYN) 500 MG tablet Take 1 tablet (500 mg total) by mouth 2 (two) times daily with a meal. 12/13/14   Abdulwahab Demelo, PA-C   BP 126/73 mmHg  Pulse 95  Temp(Src) 97.6 F (36.4 C) (Oral)  Resp 20  Ht 5\' 6"  (1.676 m)  Wt 200 lb (90.719 kg)  BMI 32.30 kg/m2  SpO2 97%  LMP 12/12/2014 Physical Exam  Constitutional: She appears well-developed and  well-nourished. No distress.  HENT:  Head: Normocephalic and atraumatic.  Eyes: Conjunctivae are normal.  Neck: Normal range of motion.  Cardiovascular: Normal rate, regular rhythm and intact distal pulses.   Pulses:      Radial pulses are 2+ on the right side, and 2+ on the left side.       Dorsalis pedis pulses are 2+  on the right side, and 2+ on the left side.       Posterior tibial pulses are 2+ on the right side, and 2+ on the left side.  Capillary refill < 3 sec  Pulmonary/Chest: Effort normal and breath sounds normal.  Musculoskeletal: She exhibits tenderness. She exhibits no edema.  ROM: Full range of motion of the bilateral knees, ankles and toes Tenderness to palpation along the plantar surface of the bilateral feet, worst over the heels and along the arches No swelling, ecchymosis, erythema, induration, wounds, lesions, rash No joint line tenderness of the ankles Negative Thompson's test No tenderness to palpation of the Achilles tendon  Neurological: She is alert. Coordination normal.  Sensation intact and on sharp in the bilateral lower extremities Strength 5/5 with flexion/extension of the bilateral knees and dorsiflexion/plantar flexion of the  bilateral ankles; 5/5 with flexion and extension of the great toes bilaterally  Skin: Skin is warm and dry. She is not diaphoretic.  No tenting of the skin  Psychiatric: She has a normal mood and affect.  Nursing note and vitals reviewed.   ED Course  Procedures (including critical care time) Labs Review Labs Reviewed - No data to display  Imaging Review No results found.   EKG Interpretation None      MDM   Final diagnoses:  Plantar fasciitis, bilateral  Right foot strain, initial encounter    [name] presents with bilateral plantar foot pain for many months. Physical exam and history are consistent with plantar fasciitis. Patient likely with heel spurs contributing to her pain. Now due to altered gait she has right lateral foot pain. No trauma or injury to indicate potential fracture. No imaging indicated at this time. Will place ASO to help support right ankle and foot. More exercises given to treat plantar fasciitis. We'll begin regular anti-inflammatory treatment and follow-up with podiatry.  BP 126/73 mmHg  Pulse 95   Temp(Src) 97.6 F (36.4 C) (Oral)  Resp 20  Ht  (1.676 m)  Wt 200 lb (90.719 kg)  BMI 32.30 kg/m2  SpO2 97%  LMP 12/12/2014   [name], PA-C 12/13/14 4098  Nancy Lyn Mackuen, MD 12/13/14 1438

## 2014-12-13 NOTE — ED Notes (Signed)
Pt reports heel pain since March 2016. Pt now reports pain to RT lateral foot.

## 2015-07-07 ENCOUNTER — Emergency Department (HOSPITAL_COMMUNITY)
Admission: EM | Admit: 2015-07-07 | Discharge: 2015-07-07 | Disposition: A | Discharge status: Home / Self Care | Payer: No Typology Code available for payment source | Attending: Emergency Medicine | Admitting: Emergency Medicine

## 2015-07-07 ENCOUNTER — Encounter (HOSPITAL_COMMUNITY): Payer: Self-pay | Admitting: *Deleted

## 2015-07-07 DIAGNOSIS — F17210 Nicotine dependence, cigarettes, uncomplicated: Secondary | ICD-10-CM | POA: Insufficient documentation

## 2015-07-07 DIAGNOSIS — R51 Headache: Secondary | ICD-10-CM | POA: Insufficient documentation

## 2015-07-07 DIAGNOSIS — K121 Other forms of stomatitis: Secondary | ICD-10-CM | POA: Insufficient documentation

## 2015-07-07 DIAGNOSIS — H53149 Visual discomfort, unspecified: Secondary | ICD-10-CM | POA: Insufficient documentation

## 2015-07-07 DIAGNOSIS — K029 Dental caries, unspecified: Secondary | ICD-10-CM | POA: Insufficient documentation

## 2015-07-07 DIAGNOSIS — K047 Periapical abscess without sinus: Secondary | ICD-10-CM | POA: Insufficient documentation

## 2015-07-07 DIAGNOSIS — R519 Headache, unspecified: Secondary | ICD-10-CM

## 2015-07-07 DIAGNOSIS — G8929 Other chronic pain: Secondary | ICD-10-CM | POA: Insufficient documentation

## 2015-07-07 DIAGNOSIS — R110 Nausea: Secondary | ICD-10-CM | POA: Insufficient documentation

## 2015-07-07 MED ORDER — OXYCODONE-ACETAMINOPHEN 5-325 MG PO TABS: ORAL_TABLET | ORAL | Status: DC

## 2015-07-07 MED ORDER — AMOXICILLIN 500 MG PO CAPS
500.0000 mg | ORAL_CAPSULE | Freq: Once | ORAL | Status: AC
  Administered 2015-07-07: 500 mg via ORAL
  Filled 2015-07-07: qty 1

## 2015-07-07 MED ORDER — AMOXICILLIN 500 MG PO CAPS: 500.0000 mg | ORAL_CAPSULE | Freq: Three times a day (TID) | ORAL | Status: DC

## 2015-07-07 MED ORDER — ONDANSETRON HCL 4 MG PO TABS: 4.0000 mg | ORAL_TABLET | Freq: Three times a day (TID) | ORAL | Status: DC | PRN

## 2015-07-07 MED ORDER — BUPIVACAINE-EPINEPHRINE (PF) 0.5% -1:200000 IJ SOLN
1.8000 mL | Freq: Once | INTRAMUSCULAR | Status: AC
  Administered 2015-07-07: 1.8 mL
  Filled 2015-07-07: qty 1.8

## 2015-07-07 MED ORDER — MORPHINE SULFATE (PF) 4 MG/ML IV SOLN
4.0000 mg | Freq: Once | INTRAVENOUS | Status: AC
  Administered 2015-07-07: 4 mg via INTRAMUSCULAR
  Filled 2015-07-07: qty 1

## 2015-07-07 MED ORDER — IBUPROFEN 600 MG PO TABS: 600.0000 mg | ORAL_TABLET | Freq: Four times a day (QID) | ORAL | Status: DC | PRN

## 2015-07-07 MED ORDER — IBUPROFEN 400 MG PO TABS
600.0000 mg | ORAL_TABLET | Freq: Once | ORAL | Status: AC
  Administered 2015-07-07: 600 mg via ORAL
  Filled 2015-07-07: qty 1

## 2015-07-07 NOTE — ED Notes (Signed)
Left lower tooth pain (x 1 month) with some facial swelling. 2 ibuprofen prior to arrival.

## 2015-07-07 NOTE — Discharge Instructions (Signed)
Dental Abscess A dental abscess is a collection of pus in or around a tooth. CAUSES This condition is caused by a bacterial infection around the root of the tooth that involves the inner part of the tooth (pulp). It may result from:  Severe tooth decay.  Trauma to the tooth that allows bacteria to enter into the pulp, such as a broken or chipped tooth.  Severe gum disease around a tooth. SYMPTOMS Symptoms of this condition include:  Severe pain in and around the infected tooth.  Swelling and redness around the infected tooth, in the mouth, or in the face.  Tenderness.  Pus drainage.  Bad breath.  Bitter taste in the mouth.  Difficulty swallowing.  Difficulty opening the mouth.  Nausea.  Vomiting.  Chills.  Swollen neck glands.  Fever. DIAGNOSIS This condition is diagnosed with examination of the infected tooth. During the exam, your dentist may tap on the infected tooth. Your dentist will also ask about your medical and dental history and may order X-rays. TREATMENT This condition is treated by eliminating the infection. This may be done with:  Antibiotic medicine.  A root canal. This may be performed to save the tooth.  Pulling (extracting) the tooth. This may also involve draining the abscess. This is done if the tooth cannot be saved. HOME CARE INSTRUCTIONS  Take medicines only as directed by your dentist.  If you were prescribed antibiotic medicine, finish all of it even if you start to feel better.  Rinse your mouth (gargle) often with salt water to relieve pain or swelling.  Do not drive or operate heavy machinery while taking pain medicine.  Do not apply heat to the outside of your mouth.  Keep all follow-up visits as directed by your dentist. This is important. SEEK MEDICAL CARE IF:  Your pain is worse and is not helped by medicine. SEEK IMMEDIATE MEDICAL CARE IF:  You have a fever or chills.  Your symptoms suddenly get worse.  You have a  very bad headache.  You have problems breathing or swallowing.  You have trouble opening your mouth.  You have swelling in your neck or around your eye.   This information is not intended to replace advice given to you by your health care provider. Make sure you discuss any questions you have with your health care provider.   Document Released: 04/27/2005 Document Revised: 09/11/2014 Document Reviewed: 04/24/2014 Elsevier Interactive Patient Education 2016 Else[employer]  General Headache Without Cause A headache is pain or discomfort felt around the head or neck area. The specific cause of a headache may not be found. There are many causes and types of headaches. A few common ones are:  Tension headaches.  Migraine headaches.  Cluster headaches.  Chronic daily headaches. HOME CARE INSTRUCTIONS  Watch your condition for any changes. Take these steps to help with your condition: Managing Pain  Take over-the-counter and prescription medicines only as told by your health care provider.  Lie down in a dark, quiet room when you have a headache.  If directed, apply ice to the head and neck area:  Put ice in a plastic bag.  Place a towel between your skin and the bag.  Leave the ice on for 20 minutes, 2-3 times per day.  Use a heating pad or hot shower to apply heat to the head and neck area as told by your health care provider.  Keep lights dim if bright lights bother you or make your headaches worse. Eating  and Drinking  Eat meals on a regular schedule.  Limit alcohol use.  Decrease the amount of caffeine you drink, or stop drinking caffeine. General Instructions  Keep all follow-up visits as told by your health care provider. This is important.  Keep a headache journal to help find out what may trigger your headaches. For example, write down:  What you eat and drink.  How much sleep you get.  Any change to your diet or medicines.  Try massage or other  relaxation techniques.  Limit stress.  Sit up straight, and do not tense your muscles.  Do not use tobacco products, including cigarettes, chewing tobacco, or e-cigarettes. If you need help quitting, ask your health care provider.  Exercise regularly as told by your health care provider.  Sleep on a regular schedule. Get 7-9 hours of sleep, or the amount recommended by your health care provider. SEEK MEDICAL CARE IF:   Your symptoms are not helped by medicine.  You have a headache that is different from the usual headache.  You have nausea or you vomit.  You have a fever. SEEK IMMEDIATE MEDICAL CARE IF:   Your headache becomes severe.  You have repeated vomiting.  You have a stiff neck.  You have a loss of vision.  You have problems with speech.  You have pain in the eye or ear.  You have muscular weakness or loss of muscle control.  You lose your balance or have trouble walking.  You feel faint or pass out.  You have confusion.   This information is not intended to replace advice given to you by your health care provider. Make sure you discuss any questions you have with your health care provider.   Document Released: 04/27/2005 Document Revised: 01/16/2015 Document Reviewed: 08/20/2014 Elsevier Interactive Patient Education [employer]

## 2015-07-07 NOTE — ED Notes (Signed)
Declined W/C at D/C and was escorted to lobby by RN. 

## 2015-07-07 NOTE — Discharge Instructions (Signed)
Eat a soft diet for the next 24-48 hours.  Rinse your mouth with saltwater or regular water after you eat any food.  Do not swallow blood or pus becuase it will cause you to vomit,  spit out instead.  Take percocet for breakthrough pain, do not drink alcohol, drive, care for children or do other critical tasks while taking percocet.  Return to the emergency room for fever, change in vision, redness to the face that rapidly spreads towards the eye, nausea or vomiting, difficulty swallowing or shortness of breath.   Apply warm compresses to jaw throughout the day.   Take your antibiotics as directed and to the end of the course. DO NOT drink alcohol when taking metronidazole, it will make you very sick!   Followup with a dentist is very important for ongoing evaluation and management of recurrent dental pain. Return to emergency department for emergent changing or worsening symptoms."  Low-cost dental clinic: [facility]  at [phone]**  **Nuala Alpha at [phone] [address]    You may also call [phone]   Dental Abscess A dental abscess is a collection of pus in or around a tooth. CAUSES This condition is caused by a bacterial infection around the root of the tooth that involves the inner part of the tooth (pulp). It may result from:  Severe tooth decay.  Trauma to the tooth that allows bacteria to enter into the pulp, such as a broken or chipped tooth.  Severe gum disease around a tooth. SYMPTOMS Symptoms of this condition include:  Severe pain in and around the infected tooth.  Swelling and redness around the infected tooth, in the mouth, or in the face.  Tenderness.  Pus drainage.  Bad breath.  Bitter taste in the mouth.  Difficulty swallowing.  Difficulty opening the mouth.  Nausea.  Vomiting.  Chills.  Swollen neck glands.  Fever. DIAGNOSIS This condition is diagnosed with examination of the infected tooth. During the exam,  your dentist may tap on the infected tooth. Your dentist will also ask about your medical and dental history and may order X-rays. TREATMENT This condition is treated by eliminating the infection. This may be done with:  Antibiotic medicine.  A root canal. This may be performed to save the tooth.  Pulling (extracting) the tooth. This may also involve draining the abscess. This is done if the tooth cannot be saved. HOME CARE INSTRUCTIONS  Take medicines only as directed by your dentist.  If you were prescribed antibiotic medicine, finish all of it even if you start to feel better.  Rinse your mouth (gargle) often with salt water to relieve pain or swelling.  Do not drive or operate heavy machinery while taking pain medicine.  Do not apply heat to the outside of your mouth.  Keep all follow-up visits as directed by your dentist. This is important. SEEK MEDICAL CARE IF:  Your pain is worse and is not helped by medicine. SEEK IMMEDIATE MEDICAL CARE IF:  You have a fever or chills.  Your symptoms suddenly get worse.  You have a very bad headache.  You have problems breathing or swallowing.  You have trouble opening your mouth.  You have swelling in your neck or around your eye.   This information is not intended to replace advice given to you by your health care provider. Make sure you discuss any questions you have with your health care provider.   Document Released: 04/27/2005 Document Revised: 09/11/2014 Document Reviewed: 04/24/2014  Risk analyst Patient Education [employer]   Dental Assistance If the dentist on-call cannot see you, please use the resources below:   Patients with Medicaid: [facility] [id number] [address], [phone] [address] [phone]  If unable to pay, or uninsured, contact [facility] [phone]) or [facility] Department [phone] in [place], [phone] in [facility]) to become  qualified for the adult dental clinic  Other Low-Cost Community Dental Services: [facility]- [address] Natasha Bence [place], [place], [address]    [phone], Ext. [phone]    2nd and 4th Thursday of the month at 6:30am    10 clients each day by appointment, can sometimes see walk-in patients if someone does not show for an appointment [facility]- [address] Ether Griffins [place], [place], [address]    [phone] [facility] [address] [place], [place], [address]    [phone]  [facility] Department- [phone] [facility] Department- [phone] [facility]- [phone]

## 2015-07-07 NOTE — ED Provider Notes (Signed)
CSN: [id number]     Arrival date & time 07/07/15  0421 History   First MD Initiated Contact with Patient 07/07/15 [phone]     Chief Complaint  Patient presents with  . Dental Pain     (Consider location/radiation/quality/duration/timing/severity/associated sxs/prior Treatment) HPI  Blood pressure 122/90, pulse 70, temperature 98.1 F (36.7 C), temperature source Oral, resp. rate 20, SpO2 95 %.  [name] is a 35 y.o. female complaining of exacerbation of chronic dental pain in the left lower jaw 1 month and also facial swelling worsening over the course of the last 24 hours. Patient took ibuprofen at home with little relief. She rates her pain at 8 out of 10, exacerbated by movement, palpation and swallowing. Denies fever/chills, neck swelling.    History reviewed. No pertinent past medical history. History reviewed. No pertinent past surgical history. No family history on file. Social History  Substance Use Topics  . Smoking status: Current Some Day Smoker -- 0.50 packs/day    Types: Cigarettes  . Smokeless tobacco: None  . Alcohol Use: No   OB History    No data available     Review of Systems  10 systems reviewed and found to be negative, except as noted in the HPI.   Allergies  Review of patient's allergies indicates no known allergies.  Home Medications   Prior to Admission medications   Medication Sig Start Date End Date Taking? Authorizing Provider  ibuprofen (ADVIL,MOTRIN) 200 MG tablet Take 200 mg by mouth every 6 (six) hours as needed for mild pain.   Yes Historical Provider, MD  HYDROcodone-acetaminophen (NORCO/VICODIN) 5-325 MG per tablet Take 2 tablets by mouth every 6 (six) hours as needed for pain. Patient not taking: Reported on 07/07/2015 09/20/12   Roxy Horseman, PA-C  ibuprofen (ADVIL,MOTRIN) 600 MG tablet Take 1 tablet (600 mg total) by mouth every 6 (six) hours as needed for pain. Patient not taking: Reported on 07/07/2015 09/20/12   Roxy Horseman, PA-C  naproxen (NAPROSYN) 500 MG tablet Take 1 tablet (500 mg total) by mouth 2 (two) times daily with a meal. Patient not taking: Reported on 07/07/2015 12/13/14   Dahlia Client Muthersbaugh, PA-C   BP 122/90 mmHg  Pulse 70  Temp(Src) 98.1 F (36.7 C) (Oral)  Resp 20  SpO2 95% Physical Exam  Constitutional: She is oriented to person, place, and time. She appears well-developed and well-nourished. No distress.  HENT:  Head: Normocephalic.  Mouth/Throat: Uvula is midline and oropharynx is clear and moist. No trismus in the jaw. No uvula swelling. No oropharyngeal exudate, posterior oropharyngeal edema, posterior oropharyngeal erythema or tonsillar abscesses.  Generally poor dentition, Left lower and fibular swelling with no overlying cellulitis, patient is tender to palpation along the left lower gumline with focal fluctuance. Patient is handling their secretions. There is no tenderness to palpation or firmness underneath tongue bilaterally. No trismus.    Eyes: Conjunctivae and EOM are normal.  Cardiovascular: Normal rate.   Pulmonary/Chest: Effort normal. No stridor.  Musculoskeletal: Normal range of motion.  Lymphadenopathy:    She has no cervical adenopathy.  Neurological: She is alert and oriented to person, place, and time.  Psychiatric: She has a normal mood and affect.  Nursing note and vitals reviewed.   ED Course  .Marland KitchenIncision and Drainage Date/Time: 07/07/2015 8:24 AM Performed by: Wynetta Emery Authorized by: Wynetta Emery Consent: Verbal consent obtained. Risks and benefits: risks, benefits and alternatives were discussed Consent given by: patient Patient identity confirmed: verbally with patient  Type: abscess Body area: mouth Location details: alveolar process Anesthesia: nerve block (Left inferior alveolar) Local anesthetic: bupivacaine 0.5% with epinephrine Anesthetic total: 1.8 ml Patient sedated: no Scalpel size: 11 Incision type: single  straight Incision depth: dermal Complexity: complex Drainage: purulent and  bloody Drainage amount: moderate Wound treatment: wound left open Packing material: none Patient tolerance: Patient tolerated the procedure well with no immediate complications   (including critical care time) Labs Review Labs Reviewed - No data to display  Imaging Review No results found. I have personally reviewed and evaluated these images and lab results as part of my medical decision-making.   EKG Interpretation None      MDM   Final diagnoses:  Dental abscess    Filed Vitals:   07/07/15 0427 07/07/15 0534 07/07/15 0751  BP: 122/90  109/75  Pulse: 70  64  Temp: 98.1 F (36.7 C)  98.5 F (36.9 C)  TempSrc: Oral  Oral  Resp: 20  16  SpO2: 95% 95% 98%    Medications  morphine 4 MG/ML injection 4 mg (4 mg Intramuscular Given 07/07/15 0636)  bupivacaine-epinephrine (MARCAINE W/ EPI) 0.5% -1:200000 injection 1.8 mL (1.8 mLs Infiltration Given 07/07/15 0647)  bupivacaine-epinephrine (MARCAINE W/ EPI) 0.5% -1:200000 injection 1.8 mL (1.8 mLs Infiltration Given 07/07/15 0647)  amoxicillin (AMOXIL) capsule 500 mg (500 mg Oral Given 07/07/15 0636)    [name] is 35 y.o. female presenting with dental pain and dental abscess. Dental block performed in an IND with a scant amount of bloody and purulent discharge. Patient will be started on amoxicillin, counseled diet, rinse her mouth after eating. Patient verbalized her understanding. Percocet for pain control.  Evaluation does not show pathology that would require ongoing emergent intervention or inpatient treatment. Pt is hemodynamically stable and mentating appropriately. Discussed findings and plan with patient/guardian, who agrees with care plan. All questions answered. Return precautions discussed and outpatient follow up given.   Discharge Medication List as of 07/07/2015  7:48 AM    START taking these medications   Details  amoxicillin  (AMOXIL) 500 MG capsule Take 1 capsule (500 mg total) by mouth 3 (three) times daily., Starting 07/07/2015, Until Discontinued, Print    oxyCODONE-acetaminophen (PERCOCET/ROXICET) 5-325 MG tablet 1 to 2 tabs PO q6hrs  PRN for pain, Print             Wynetta Emery, PA-C 07/07/15 0825  Devoria Albe, MD 07/08/15 [phone]

## 2015-07-07 NOTE — ED Notes (Signed)
Pt reports a HA since waking up from a nap. Pt was seen this AM for tooth abscess.

## 2015-07-07 NOTE — ED Provider Notes (Signed)
CSN: [id number]     Arrival date & time 07/07/15  1722 History  By signing my name below, I, Evon Slack, attest that this documentation has been prepared under the direction and in the presence of Danelle Berry, PA-C. Electronically Signed: Evon Slack, ED Scribe. 07/07/2015. 5:45 PM.    Chief Complaint  Patient presents with  . Headache   Patient is a 35 y.o. female presenting with headaches. The history is provided by the patient. No language interpreter was used.  Headache Pain location:  Frontal and occipital Quality:  Dull Radiates to:  Does not radiate Severity currently:  10/10 Severity at highest:  10/10 Onset quality:  Gradual Duration:  7 hours Timing:  Constant Progression:  Unchanged Chronicity:  New Similar to prior headaches: yes   Context: activity, bright light (mild photophobia) and caffeine (sudden withdrawal from caffeinated drinks)   Context: not coughing, not defecating, not eating, not stress, not exposure to cold air, not intercourse, not loud noise and not straining   Worsened by:  Nothing Ineffective treatments:  None tried Associated symptoms: nausea, neck pain and photophobia   Associated symptoms: no back pain, no blurred vision, no congestion, no cough, no diarrhea, no dizziness, no drainage, no ear pain, no eye pain, no fatigue, no fever, no loss of balance, no myalgias, no neck stiffness, no numbness, no sinus pressure, no sore throat, no syncope, no vomiting and no weakness    HPI Comments: [name] is a 35 y.o. female who presents to the Emergency Department complaining of new HA onset 1 today. Pt states she was discharged this morning after having a dental abscess drained. Pt is complaining of having frontal and posterior HA and neck pain. She describes the posterior HA as dull. She rates the severity of her pain is 10/10. She reports associated photophobia and nausea. She states she has tried placing a cool rag on her head with no relief of  the HA. Pt states she has been compliant with the antibiotics and pain medication. She states that pain in her tooth is improving. Denies fever, trouble swallowing, vomiting or other related symptoms.   History reviewed. No pertinent past medical history. History reviewed. No pertinent past surgical history. History reviewed. No pertinent family history. Social History  Substance Use Topics  . Smoking status: Current Some Day Smoker -- 0.50 packs/day    Types: Cigarettes  . Smokeless tobacco: None  . Alcohol Use: No   OB History    No data available      Review of Systems  Constitutional: Negative for fever, chills, diaphoresis, activity change, appetite change and fatigue.  HENT: Positive for dental problem and facial swelling (unchanged from recent ER visit). Negative for congestion, ear discharge, ear pain, postnasal drip, sinus pressure, sore throat, tinnitus, trouble swallowing and voice change.   Eyes: Positive for photophobia. Negative for blurred vision, pain, discharge, redness, itching and visual disturbance.  Respiratory: Negative.  Negative for cough, chest tightness and shortness of breath.   Cardiovascular: Negative.  Negative for syncope.  Gastrointestinal: Positive for nausea. Negative for vomiting and diarrhea.  Genitourinary: Negative.   Musculoskeletal: Positive for neck pain. Negative for myalgias, back pain, joint swelling and neck stiffness.  Skin: Negative.   Neurological: Positive for headaches. Negative for dizziness, tremors, syncope, facial asymmetry, speech difficulty, weakness, light-headedness, numbness and loss of balance.  Psychiatric/Behavioral: Negative.   All other systems reviewed and are negative.    Allergies  Review of patient's allergies indicates no  known allergies.  Home Medications   Prior to Admission medications   Medication Sig Start Date End Date Taking? Authorizing Provider  amoxicillin (AMOXIL) 500 MG capsule Take 1 capsule (500  mg total) by mouth 3 (three) times daily. 07/07/15   Nicole Pisciotta, PA-C  HYDROcodone-acetaminophen (NORCO/VICODIN) 5-325 MG per tablet Take 2 tablets by mouth every 6 (six) hours as needed for pain. Patient not taking: Reported on 07/07/2015 09/20/12   Roxy Horseman, PA-C  ibuprofen (ADVIL,MOTRIN) 600 MG tablet Take 1 tablet (600 mg total) by mouth every 6 (six) hours as needed. 07/07/15   Danelle Berry, PA-C  naproxen (NAPROSYN) 500 MG tablet Take 1 tablet (500 mg total) by mouth 2 (two) times daily with a meal. Patient not taking: Reported on 07/07/2015 12/13/14   Dahlia Client Muthersbaugh, PA-C  ondansetron (ZOFRAN) 4 MG tablet Take 1 tablet (4 mg total) by mouth every 8 (eight) hours as needed for nausea or vomiting. 07/07/15   Danelle Berry, PA-C  oxyCODONE-acetaminophen (PERCOCET/ROXICET) 5-325 MG tablet 1 to 2 tabs PO q6hrs  PRN for pain 07/07/15   Nicole Pisciotta, PA-C   BP 143/70 mmHg  Pulse 65  Temp(Src) 98.6 F (37 C) (Oral)  Resp 18  SpO2 100%   Physical Exam  Constitutional: She is oriented to person, place, and time. She appears well-developed and well-nourished. No distress.  HENT:  Head: Normocephalic and atraumatic. Head is without right periorbital erythema and without left periorbital erythema.  Right Ear: External ear normal.  Left Ear: External ear normal.  Nose: Nose normal.  Mouth/Throat: Uvula is midline, oropharynx is clear and moist and mucous membranes are normal. Mucous membranes are not pale, not dry and not cyanotic. She has dentures. No oral lesions. No trismus in the jaw. Abnormal dentition. Dental caries present. No dental abscesses or uvula swelling. No oropharyngeal exudate, posterior oropharyngeal edema, posterior oropharyngeal erythema or tonsillar abscesses.  No sublingual tenderness, no trismus.  Vast dental decay to her lower teeth, upper dentures present TTP to buccal and alveolar gums of teeth #20-22, no fluctuance, no active drainage    Eyes: Conjunctivae,  EOM and lids are normal. Pupils are equal, round, and reactive to light. Right eye exhibits no discharge. Left eye exhibits no discharge. No scleral icterus.  Neck: Trachea normal, normal range of motion, full passive range of motion without pain and phonation normal. Neck supple. No JVD present. No spinous process tenderness and no muscular tenderness present. No rigidity. No tracheal deviation, no edema, no erythema and normal range of motion present. No Brudzinski's sign and no Kernig's sign noted. No thyroid mass present.  No erythema or edema noted.   Cardiovascular: Normal rate and regular rhythm.   Pulmonary/Chest: Effort normal and breath sounds normal. No stridor. No respiratory distress.  Musculoskeletal: Normal range of motion. She exhibits no edema.  Lymphadenopathy:    She has no cervical adenopathy.  Neurological: She is alert and oriented to person, place, and time. She has normal strength. She is not disoriented. She displays no tremor. No cranial nerve deficit or sensory deficit. She exhibits normal muscle tone. She displays a negative Romberg sign. She displays no seizure activity. Coordination and gait normal. GCS eye subscore is 4. GCS verbal subscore is 5. GCS motor subscore is 6.  Skin: Skin is warm and dry. No rash noted. She is not diaphoretic. No erythema. No pallor.  Psychiatric: She has a normal mood and affect. Her behavior is normal. Judgment and thought content normal.  Nursing note  and vitals reviewed.   ED Course  Procedures (including critical care time) DIAGNOSTIC STUDIES: Oxygen Saturation is 100% on RA, normal by my interpretation.    COORDINATION OF CARE: 5:42 PM-Discussed treatment plan with pt at bedside and pt agreed to plan.     Labs Review Labs Reviewed - No data to display  Imaging Review No results found. .   EKG Interpretation None      MDM   Pt with dental pain, periapical abscess drained earlier today in this ER, pt has unchanged  swelling, improved pain, no fever, sweats, N, V. She has decreased her caffeine intake, she states that she drinks "coke like its water" and has a mild HA since 11 am today after waking up from a nap.  She has not tried any OTC medications.  She is able to eat, drink and take medications w/o difficulty.  Suspect headache secondary to caffeine withdrawal and/or dental infection.  No concern for ludwigs angina or spreading dental infection.   Pt is well appearing, neuro exam grossly intact.  No signs or symptoms concerning for meningitis.    I have reviewed pt's return precautions with her.  She has dental f/up.  Pt was discharged with stable VS.    Final diagnoses:  Periapical abscess  Acute nonintractable headache, unspecified headache type    I personally performed the services described in this documentation, which was scribed in my presence. The recorded information has been reviewed and is accurate.       Danelle Berry, PA-C 07/11/15 1610  Doug Sou, MD 07/12/15 [phone]

## 2015-09-11 ENCOUNTER — Emergency Department (HOSPITAL_COMMUNITY)
Admission: EM | Admit: 2015-09-11 | Discharge: 2015-09-11 | Disposition: A | Discharge status: Home / Self Care | Payer: No Typology Code available for payment source | Attending: Emergency Medicine | Admitting: Emergency Medicine

## 2015-09-11 ENCOUNTER — Encounter (HOSPITAL_COMMUNITY): Payer: Self-pay | Admitting: Emergency Medicine

## 2015-09-11 DIAGNOSIS — Z792 Long term (current) use of antibiotics: Secondary | ICD-10-CM | POA: Insufficient documentation

## 2015-09-11 DIAGNOSIS — K0889 Other specified disorders of teeth and supporting structures: Secondary | ICD-10-CM | POA: Insufficient documentation

## 2015-09-11 DIAGNOSIS — F17210 Nicotine dependence, cigarettes, uncomplicated: Secondary | ICD-10-CM | POA: Insufficient documentation

## 2015-09-11 MED ORDER — HYDROCODONE-ACETAMINOPHEN 5-325 MG PO TABS
1.0000 | ORAL_TABLET | Freq: Once | ORAL | Status: AC
  Administered 2015-09-11: 1 via ORAL
  Filled 2015-09-11: qty 1

## 2015-09-11 MED ORDER — AMOXICILLIN 500 MG PO CAPS: 500.0000 mg | ORAL_CAPSULE | Freq: Three times a day (TID) | ORAL | Status: DC

## 2015-09-11 MED ORDER — HYDROCODONE-ACETAMINOPHEN 5-325 MG PO TABS: ORAL_TABLET | ORAL | Status: DC

## 2015-09-11 NOTE — ED Notes (Signed)
Patient's husband on the way in to pick her up.   Patient will wait for pain medicine until he comes in.

## 2015-09-11 NOTE — ED Notes (Signed)
Patient states has had a broken tooth x 6-8 months.   Patient states she has been to a dentist, but they advised that she needed to have tooth extracted.   Patient states she is supposed to go to [facility], but hasn't gotten there yet for extraction.

## 2015-09-11 NOTE — Discharge Instructions (Signed)
Please read and follow all provided instructions.  Your diagnoses today include:  1. Pain, dental    The exam and treatment you received today has been provided on an emergency basis only. This is not a substitute for complete medical or dental care.  Tests performed today include:  Vital signs. See below for your results today.   Medications prescribed:   Amoxicillin - antibiotic  You have been prescribed an antibiotic medicine: take the entire course of medicine even if you are feeling better. Stopping early can cause the antibiotic not to work.   Vicodin (hydrocodone/acetaminophen) - narcotic pain medication  DO NOT drive or perform any activities that require you to be awake and alert because this medicine can make you drowsy. BE VERY CAREFUL not to take multiple medicines containing Tylenol (also called acetaminophen). Doing so can lead to an overdose which can damage your liver and cause liver failure and possibly death.   Take any prescribed medications only as directed.  Home care instructions:  Follow any educational materials contained in this packet.  Follow-up instructions: Please follow-up with your dentist for further evaluation of your symptoms.   Dental Assistance: See below for dental referrals  Return instructions:   Please return to the Emergency Department if you experience worsening symptoms.  Please return if you develop a fever, you develop more swelling in your face or neck, you have trouble breathing or swallowing food.  Please return if you have any other emergent concerns.  Additional Information:  Your vital signs today were: BP 118/84 mmHg   Pulse 68   Temp(Src) 98.6 F (37 C) (Oral)   Resp 18   SpO2 100% If your blood pressure (BP) was elevated above 135/85 this visit, please have this repeated by your doctor within one month. --------------

## 2015-09-11 NOTE — ED Provider Notes (Signed)
CSN: [id number]     Arrival date & time 09/11/15  1334 History  By signing my name below, I, Evon Slack, attest that this documentation has been prepared under the direction and in the presence of Renne Crigler, PA-C. Electronically Signed: Evon Slack, ED Scribe. 09/11/2015. 2:30 PM.       Chief Complaint  Patient presents with  . Dental Pain   The history is provided by the patient. No language interpreter was used.   HPI Comments: [name] is a 35 y.o. female who presents to the Emergency Department complaining of right sided dental pain onset 8 months prior. She states she has associated right sided facial swelling. Pt states that the pain is radiating to her right jaw and right ear. Pt states she is scheduled to have the tooth extracted. She is requesting antibiotics for possible infection. Pt states she tried applying garlic clove to the tooth with no relief. Denies neck pain, trouble breathing, trouble swallowing fever nausea or vomiting.    No past medical history on file. No past surgical history on file. No family history on file. Social History  Substance Use Topics  . Smoking status: Current Some Day Smoker -- 0.50 packs/day    Types: Cigarettes  . Smokeless tobacco: Not on file  . Alcohol Use: No   OB History    No data available      Review of Systems  Constitutional: Negative for fever.  HENT: Positive for dental problem. Negative for trouble swallowing.   Respiratory: Negative for shortness of breath.   Gastrointestinal: Negative for nausea and vomiting.  Musculoskeletal: Negative for neck pain.    Allergies  Review of patient's allergies indicates no known allergies.  Home Medications   Prior to Admission medications   Medication Sig Start Date End Date Taking? Authorizing Provider  amoxicillin (AMOXIL) 500 MG capsule Take 1 capsule (500 mg total) by mouth 3 (three) times daily. 07/07/15   Nicole Pisciotta, PA-C  HYDROcodone-acetaminophen  (NORCO/VICODIN) 5-325 MG per tablet Take 2 tablets by mouth every 6 (six) hours as needed for pain. Patient not taking: Reported on 07/07/2015 09/20/12   Roxy Horseman, PA-C  ibuprofen (ADVIL,MOTRIN) 600 MG tablet Take 1 tablet (600 mg total) by mouth every 6 (six) hours as needed. 07/07/15   Danelle Berry, PA-C  naproxen (NAPROSYN) 500 MG tablet Take 1 tablet (500 mg total) by mouth 2 (two) times daily with a meal. Patient not taking: Reported on 07/07/2015 12/13/14   Dahlia Client Muthersbaugh, PA-C  ondansetron (ZOFRAN) 4 MG tablet Take 1 tablet (4 mg total) by mouth every 8 (eight) hours as needed for nausea or vomiting. 07/07/15   Danelle Berry, PA-C  oxyCODONE-acetaminophen (PERCOCET/ROXICET) 5-325 MG tablet 1 to 2 tabs PO q6hrs  PRN for pain 07/07/15   Joni Reining Pisciotta, PA-C   BP 118/84 mmHg  Pulse 68  Temp(Src) 98.6 F (37 C) (Oral)  Resp 18  SpO2 100%   Physical Exam  Constitutional: She is oriented to person, place, and time. She appears well-developed and well-nourished. No distress.  HENT:  Head: Normocephalic and atraumatic.  Eyes: Conjunctivae and EOM are normal.  Neck: Neck supple. No tracheal deviation present.  Cardiovascular: Normal rate.   Pulmonary/Chest: Effort normal. No respiratory distress.  Musculoskeletal: Normal range of motion.  Neurological: She is alert and oriented to person, place, and time.  Skin: Skin is warm and dry.  Psychiatric: She has a normal mood and affect. Her behavior is normal.  Nursing note and  vitals reviewed.   ED Course  Procedures (including critical care time) DIAGNOSTIC STUDIES: Oxygen Saturation is 100% on RA, normal by my interpretation.    COORDINATION OF CARE: 2:30 PM-Discussed treatment plan with pt at bedside and pt agreed to plan.   Vital signs reviewed and are as follows: BP 118/84 mmHg  Pulse 68  Temp(Src) 98.6 F (37 C) (Oral)  Resp 18  SpO2 100%  Patient counseled on use of narcotic pain medications. Counseled not to combine  these medications with others containing tylenol. Urged not to drink alcohol, drive, or perform any other activities that requires focus while taking these medications. The patient verbalizes understanding and agrees with the plan.  Patient states her husband is coming to pick her up from the ED. PO Vicodin x 1 ordered here.   Patient counseled to take prescribed medications as directed, return with worsening facial or neck swelling, and to follow-up with their dentist as soon as possible.    MDM   Final diagnoses:  Pain, dental   Patient with toothache. No fever. Exam unconcerning for Ludwig's angina or other deep tissue infection in neck.   Will treat with antibiotic and pain medicine. Pt states that she is due to have surgery in the next 1 week. Urged patient to follow-up with dentist.    I personally performed the services described in this documentation, which was scribed in my presence. The recorded information has been reviewed and is accurate.     Renne CriglerJoshua Johnluke Haugen, PA-C 09/11/15 1441  Benjiman CoreNathan Pickering, MD 09/11/15 [phone]

## 2016-10-14 ENCOUNTER — Other Ambulatory Visit: Payer: Self-pay

## 2018-04-09 ENCOUNTER — Emergency Department (HOSPITAL_COMMUNITY)
Admission: EM | Admit: 2018-04-09 | Discharge: 2018-04-09 | Disposition: A | Discharge status: Home / Self Care | Payer: Self-pay | Attending: Emergency Medicine | Admitting: Emergency Medicine

## 2018-04-09 ENCOUNTER — Other Ambulatory Visit: Payer: Self-pay

## 2018-04-09 ENCOUNTER — Encounter (HOSPITAL_COMMUNITY): Payer: Self-pay

## 2018-04-09 DIAGNOSIS — F17210 Nicotine dependence, cigarettes, uncomplicated: Secondary | ICD-10-CM | POA: Insufficient documentation

## 2018-04-09 DIAGNOSIS — M545 Low back pain, unspecified: Secondary | ICD-10-CM

## 2018-04-09 MED ORDER — NAPROXEN 500 MG PO TABS: 500.0000 mg | ORAL_TABLET | Freq: Two times a day (BID) | ORAL | 0 refills | Status: DC

## 2018-04-09 MED ORDER — LIDOCAINE 5 % EX PTCH: 1.0000 | MEDICATED_PATCH | CUTANEOUS | 0 refills | Status: DC

## 2018-04-09 MED ORDER — METHOCARBAMOL 500 MG PO TABS: 500.0000 mg | ORAL_TABLET | Freq: Three times a day (TID) | ORAL | 0 refills | Status: DC | PRN

## 2018-04-09 NOTE — ED Provider Notes (Signed)
[facility]   CSN: [id number] Arrival date & time: 04/09/18  1037     History   Chief Complaint Chief Complaint  Patient presents with  . Back Pain    HPI [name] is a 37 y.o. female with a hx of tobacco abuse who presents to the ED with complaint of back pain which started 2 days prior. Patient states that night prior to onset of pain she slept on a pull out couch in a hotel and woke up the next AM with the pain. She states pain is a tightness to the lower back that is a 7/10 in severity, worse with twisting/turning/bending motions. Tried ibuprofen without relief. Hx of somewhat similar pain a long time ago. Denies numbness, tingling, weakness, saddle anesthesia, incontinence to bowel/bladder, fever, chills, IV drug use, dysuria, or hx of cancer. Denies traumatic injury. Denies chance of pregnancy.   HPI  History reviewed. No pertinent past medical history.  There are no active problems to display for this patient.   History reviewed. No pertinent surgical history.   OB History   None      Home Medications    Prior to Admission medications   Medication Sig Start Date End Date Taking? Authorizing Provider  amoxicillin (AMOXIL) 500 MG capsule Take 1 capsule (500 mg total) by mouth 3 (three) times daily. 09/11/15   Renne CriglerGeiple, Joshua, PA-C  HYDROcodone-acetaminophen (NORCO/VICODIN) 5-325 MG tablet Take 1-2 tablets every 6 hours as needed for severe pain 09/11/15   Renne CriglerGeiple, Joshua, PA-C    Family History History reviewed. No pertinent family history.  Social History Social History   Tobacco Use  . Smoking status: Current Some Day Smoker    Packs/day: 0.50    Types: Cigarettes  . Smokeless tobacco: Never Used  Substance Use Topics  . Alcohol use: No  . Drug use: Not on file     Allergies   Patient has no known allergies.   Review of Systems Review of Systems  Constitutional: Negative for chills, fever and  unexpected weight change.  Respiratory: Negative for shortness of breath.   Cardiovascular: Negative for chest pain.  Genitourinary: Negative for dysuria.  Musculoskeletal: Positive for back pain.  Neurological: Negative for weakness and numbness.       Negative for incontinence or saddle anesthesia.      Physical Exam Updated Vital Signs BP 118/84 (BP Location: Right Arm)   Pulse 81   Temp 97.8 F (36.6 C) (Oral)   Resp 20   LMP 03/30/2018 (Within Days)   SpO2 100%   Physical Exam  Constitutional: She appears well-developed and well-nourished. No distress.  HENT:  Head: Normocephalic and atraumatic.  Eyes: Conjunctivae are normal. Right eye exhibits no discharge. Left eye exhibits no discharge.  Abdominal: Soft. She exhibits no distension. There is no tenderness.  Musculoskeletal:  No obvious deformity, appreciable swelling, erythema, ecchymosis, warmth, or open wounds.  Back: No point/focal midline tenderness to palpation. Bilateral lumbar paraspinal muscle tenderness to palpation.   Neurological: She is alert.  Clear speech. Sensation grossly intact to bilateral lower extremities. 5/5 strength with knee flexion/extension and ankle plantar/dorsiflexion. Patellar DTRs are 2+ and symmetric. Ambulatory w/ steady gait.   Skin: Skin is warm and dry. No rash noted.  Psychiatric: She has a normal mood and affect. Her behavior is normal. Thought content normal.  Nursing note and vitals reviewed.    ED Treatments / Results  Labs (all labs ordered are listed,  but only abnormal results are displayed) Labs Reviewed - No data to display  EKG None  Radiology No results found.  Procedures Procedures (including critical care time)  Medications Ordered in ED Medications - No data to display   Initial Impression / Assessment and Plan / ED Course  I have reviewed the triage vital signs and the nursing notes.  Pertinent labs & imaging results that were available during my care  of the patient were reviewed by me and considered in my medical decision making (see chart for details).    Patient presents with complaint of back pain.  Patient is nontoxic appearing, vitals are WNL. Patient has normal neurologic exam, no point/focal midline tenderness to palpation. She is ambulatory in the ED.  No back pain red flags. No urinary sxs. Most likely muscle strain versus spasm after sleeping on pull out couch. Considered disc disease, UTI/pyelonephritis, kidney stone, aortic aneurysm/dissection, cauda equina or epidural abscess however these do not fit clinical picture at this time. Will treat with lidoderm patches, Naproxen, and Robaxin, discussed with patient that they are not to drive or operate heavy machinery while taking Robaxin. Recommended application of heat. I discussed treatment plan, need for PCP follow-up, and return precautions with the patient. Provided opportunity for questions, patient confirmed understanding and is in agreement with plan.   Final Clinical Impressions(s) / ED Diagnoses   Final diagnoses:  Acute bilateral low back pain without sciatica    ED Discharge Orders         Ordered    naproxen (NAPROSYN) 500 MG tablet  2 times daily     04/09/18 1120    methocarbamol (ROBAXIN) 500 MG tablet  Every 8 hours PRN     04/09/18 1120    lidocaine (LIDODERM) 5 %  Every 24 hours     04/09/18 1120           Jamirra Curnow, [place], PA-C 04/09/18 1123    Mesner, Barbara Cower, MD 04/09/18 [phone]

## 2018-04-09 NOTE — ED Triage Notes (Signed)
Pt states she has lower back pain that started this morning. Reports painful to bend over. She reports sleeping on pull out couch 2 days ago. Reports 800mg  ibuprofen at 0800 with no relief.

## 2018-04-09 NOTE — Discharge Instructions (Addendum)
You were seen in the emergency department for back pain today.  At this time we suspect that your pain is related to a muscle strain/spasm.   I have prescribed you an anti-inflammatory medication and a muscle relaxer.  - Naproxen is a nonsteroidal anti-inflammatory medication that will help with pain and swelling. Be sure to take this medication as prescribed with food, 1 pill every 12 hours,  It should be taken with food, as it can cause stomach upset, and more seriously, stomach bleeding. Do not take other nonsteroidal anti-inflammatory medications with this such as Advil, Motrin, Aleve, Mobic, Goodie Powder, or Motrin.    - Robaxin is the muscle relaxer I have prescribed, this is meant to help with muscle tightness. Be aware that this medication may make you drowsy therefore the first time you take this it should be at a time you are in an environment where you can rest. Do not drive or operate heavy machinery when taking this medication. Do not drink alcohol or take other sedating medications with this medicine such as narcotics or benzodiazepines.   - Lidoderm patches these are topical patches, please place one patch in area of pain daily.   You make take Tylenol per over the counter dosing with these medications.   We have prescribed you new medication(s) today. Discuss the medications prescribed today with your pharmacist as they can have adverse effects and interactions with your other medicines including over the counter and prescribed medications. Seek medical evaluation if you start to experience new or abnormal symptoms after taking one of these medicines, seek care immediately if you start to experience difficulty breathing, feeling of your throat closing, facial swelling, or rash as these could be indications of a more serious allergic reaction  The application of heat can help soothe the pain.  Maintaining your daily activities, including walking, is encourged, as it will help you get  better faster than just staying in bed.  Your pain should get better over the next 2 weeks.  You will need to follow up with  Your primary healthcare provider in 1-2 weeks for reassessment, if you do not have a primary care provider one is provided in your discharge instructions- you may see the [facility] or call the provided phone number. However return to the ER should you develop ne or worsening symptoms or any other concerns including but not limited to severe or worsening pain, low back pain with fever, numbness, weakness, loss of bowel or bladder control, or inability to walk or urinate, you should return to the ER immediately.

## 2018-12-14 ENCOUNTER — Ambulatory Visit: Payer: Self-pay

## 2018-12-14 NOTE — Telephone Encounter (Signed)
Pt. Reports she started having irregular vaginal bleeding 6 weeks ago. Having a period every 2 weeks. Does not have health insurance and does not have a PCP. [facility].

## 2019-10-30 ENCOUNTER — Emergency Department (HOSPITAL_COMMUNITY): Admission: EM | Admit: 2019-10-30 | Discharge: 2019-10-30 | Discharge status: Against Medical Advice | Payer: Self-pay

## 2019-10-30 DIAGNOSIS — N12 Tubulo-interstitial nephritis, not specified as acute or chronic: Secondary | ICD-10-CM | POA: Insufficient documentation

## 2019-10-30 DIAGNOSIS — Z720 Tobacco use: Secondary | ICD-10-CM | POA: Insufficient documentation

## 2019-10-30 DIAGNOSIS — N200 Calculus of kidney: Secondary | ICD-10-CM | POA: Insufficient documentation

## 2020-09-23 ENCOUNTER — Ambulatory Visit: Payer: Self-pay

## 2020-09-23 NOTE — Telephone Encounter (Signed)
Returned pt call. Pt has cloudy urine and some flank pain that may be attributed to scoliosis. Pt has significant history of kidney stones, kidney infections and UTIs. During a least one other infection pt had no significant pain. Pt is uninsured and has no PCP. Pt is leaving for vacation next week.   Given history [facility] Pt will go there tomorrow.   Reason for Disposition . All other urine symptoms  Answer Assessment - Initial Assessment Questions 1. SYMPTOM: "What's the main symptom you're concerned about?" (e.g., frequency, incontinence)     Cloudy urine - history of UTI and kidney stones 2. ONSET: "When did the  cloudiness  start?"     A few daysago 3. PAIN: "Is there any pain?" If Yes, ask: "How bad is it?" (Scale: 1-10; mild, moderate, severe)     no 4. CAUSE: "What do you think is causing the symptoms?"     unknown 5. OTHER SYMPTOMS: "Do you have any other symptoms?" (e.g., fever, flank pain, blood in urine, pain with urination)     no 6. PREGNANCY: "Is there any chance you are pregnant?" "When was your last menstrual period?"     no  Protocols used: URINARY [facility]

## 2020-09-24 ENCOUNTER — Other Ambulatory Visit: Payer: Self-pay

## 2020-09-24 ENCOUNTER — Ambulatory Visit
Admission: EM | Admit: 2020-09-24 | Discharge: 2020-09-24 | Disposition: A | Discharge status: Home / Self Care | Payer: Medicaid Other | Attending: Family Medicine | Admitting: Family Medicine

## 2020-09-24 DIAGNOSIS — R350 Frequency of micturition: Secondary | ICD-10-CM | POA: Insufficient documentation

## 2020-09-24 DIAGNOSIS — R300 Dysuria: Secondary | ICD-10-CM | POA: Insufficient documentation

## 2020-09-24 LAB — POCT URINALYSIS DIP (MANUAL ENTRY)
Bilirubin, UA: NEGATIVE
Blood, UA: NEGATIVE
Glucose, UA: NEGATIVE mg/dL
Ketones, POC UA: NEGATIVE mg/dL
Nitrite, UA: NEGATIVE
Protein Ur, POC: NEGATIVE mg/dL
Spec Grav, UA: >=1.03 — AB (ref 1.010–1.025)
Urobilinogen, UA: 0.2 E.U./dL
pH, UA: 5.5 (ref 5.0–8.0)

## 2020-09-24 MED ORDER — CIPROFLOXACIN HCL 500 MG PO TABS: 500.0000 mg | ORAL_TABLET | Freq: Two times a day (BID) | ORAL | 0 refills | Status: DC

## 2020-09-24 NOTE — ED Provider Notes (Signed)
[facility]    CSN: [id number] Arrival date & time: 09/24/20  1712      History   Chief Complaint Chief Complaint  Patient presents with  . Urinary Frequency    Pt states that she has some urinary frequency, discoloration of urine and lower back pain. X3 days Pt states that she has been having recurrent uti's.    HPI [name] is a 40 y.o. female.   Previously hospitalized with pyelonephritis. Dysuria, low back pain, cloudy urine for the last 3 days.  Has not attempted OTC treatment.  There are no alleviating factors.  Symptoms are worse with urination.  Denies vaginal discharge, odor, itching, dyspareunia, chills, fever, other symptoms.  ROS per HPI  The history is provided by the patient.  Urinary Frequency    History reviewed. No pertinent past medical history.  There are no problems to display for this patient.   History reviewed. No pertinent surgical history.  OB History   No obstetric history on file.      Home Medications    Prior to Admission medications   Medication Sig Start Date End Date Taking? Authorizing Provider  ciprofloxacin (CIPRO) 500 MG tablet Take 1 tablet (500 mg total) by mouth 2 (two) times daily. 09/24/20  Yes Moshe Cipro, NP  lidocaine (LIDODERM) 5 % Place 1 patch onto the skin daily. Remove & Discard patch within 12 hours or as directed by MD 04/09/18   Petrucelli, Pleas Koch, PA-C  methocarbamol (ROBAXIN) 500 MG tablet Take 1 tablet (500 mg total) by mouth every 8 (eight) hours as needed. 04/09/18   Petrucelli, Samantha R, PA-C  naproxen (NAPROSYN) 500 MG tablet Take 1 tablet (500 mg total) by mouth 2 (two) times daily. 04/09/18   Petrucelli, Pleas Koch, PA-C    Family History History reviewed. No pertinent family history.  Social History Social History   Tobacco Use  . Smoking status: Current Some Day Smoker    Packs/day: 0.50    Types: Cigarettes  . Smokeless tobacco: Never Used  Substance Use Topics   . Alcohol use: No     Allergies   Patient has no known allergies.   Review of Systems Review of Systems  Genitourinary: Positive for frequency.     Physical Exam Triage Vital Signs ED Triage Vitals  Enc Vitals Group     BP 09/24/20 1807 129/85     Pulse Rate 09/24/20 1807 77     Resp --      Temp 09/24/20 1807 99.1 F (37.3 C)     Temp Source 09/24/20 1807 Oral     SpO2 09/24/20 1807 98 %     Weight 09/24/20 1805 189 lb (85.7 kg)     Height 09/24/20 1805 5\' 6"  (1.676 m)     Head Circumference --      Peak Flow --      Pain Score 09/24/20 1805 5     Pain Loc --      Pain Edu? --      Excl. in GC? --    No data found.  Updated Vital Signs BP 129/85 (BP Location: Right Arm)   Pulse 77   Temp 99.1 F (37.3 C) (Oral)   Ht 5\' 6"  (1.676 m)   Wt 189 lb (85.7 kg)   LMP 09/09/2020   SpO2 98%   BMI 30.51 kg/m   Visual Acuity Right Eye Distance:   Left Eye Distance:   Bilateral Distance:  Right Eye Near:   Left Eye Near:    Bilateral Near:     Physical Exam Vitals and nursing note reviewed.  Constitutional:      General: She is not in acute distress.    Appearance: Normal appearance. She is well-developed. She is not ill-appearing.  HENT:     Head: Normocephalic and atraumatic.     Mouth/Throat:     Mouth: Mucous membranes are moist.     Pharynx: Oropharynx is clear.  Eyes:     Extraocular Movements: Extraocular movements intact.     Conjunctiva/sclera: Conjunctivae normal.     Pupils: Pupils are equal, round, and reactive to light.  Cardiovascular:     Rate and Rhythm: Normal rate and regular rhythm.     Heart sounds: No murmur heard.   Pulmonary:     Effort: Pulmonary effort is normal.  Abdominal:     General: There is no distension.     Palpations: Abdomen is soft. There is no mass.     Tenderness: There is no abdominal tenderness. There is no right CVA tenderness, left CVA tenderness, guarding or rebound.     Hernia: No hernia is present.   Musculoskeletal:        General: Normal range of motion.     Cervical back: Normal range of motion and neck supple.  Skin:    General: Skin is warm and dry.     Capillary Refill: Capillary refill takes less than 2 seconds.  Neurological:     General: No focal deficit present.     Mental Status: She is alert and oriented to person, place, and time.  Psychiatric:        Mood and Affect: Mood normal.        Behavior: Behavior normal.        Thought Content: Thought content normal.      UC Treatments / Results  Labs (all labs ordered are listed, but only abnormal results are displayed) Labs Reviewed  URINE CULTURE - Abnormal; Notable for the following components:      Result Value   Culture MULTIPLE SPECIES PRESENT, SUGGEST RECOLLECTION (*)    All other components within normal limits  POCT URINALYSIS DIP (MANUAL ENTRY) - Abnormal; Notable for the following components:   Clarity, UA cloudy (*)    Spec Grav, UA >=1.030 (*)    Leukocytes, UA Trace (*)    All other components within normal limits    EKG   Radiology No results found.  Procedures Procedures (including critical care time)  Medications Ordered in UC Medications - No data to display  Initial Impression / Assessment and Plan / UC Course  I have reviewed the triage vital signs and the nursing notes.  Pertinent labs & imaging results that were available during my care of the patient were reviewed by me and considered in my medical decision making (see chart for details).    Dysuria Urinary frequency  Cipro prescribed 500 mg twice daily x7 days given history of pyelonephritis and clinical presentation Will culture urine and will be in contact with abnormal results that require further treatment Drink plenty of fluids and get some rest May take AZO over-the-counter Follow up with this office or with primary care if symptoms are persisting.  Follow up in the ER for high fever, trouble swallowing, trouble  breathing, other concerning symptoms.   Final Clinical Impressions(s) / UC Diagnoses   Final diagnoses:  Dysuria  Urinary frequency  Discharge Instructions     I have sent in Cipro for you to take twice a day for 7 days  Follow up with this office or with primary care if symptoms are persisting.  Follow up in the ER for high fever, trouble swallowing, trouble breathing, other concerning symptoms.     ED Prescriptions    Medication Sig Dispense Auth. Provider   ciprofloxacin (CIPRO) 500 MG tablet Take 1 tablet (500 mg total) by mouth 2 (two) times daily. 14 tablet Moshe Cipro, NP     PDMP not reviewed this encounter.   Moshe Cipro, NP 10/01/20 [phone]

## 2020-09-24 NOTE — ED Triage Notes (Signed)
Pt states that she ha urinary frequency,discoloration of urine and lower back pain. x3 days

## 2020-09-24 NOTE — Discharge Instructions (Signed)
I have sent in Cipro for you to take twice a day for 7 days  Follow up with this office or with primary care if symptoms are persisting.  Follow up in the ER for high fever, trouble swallowing, trouble breathing, other concerning symptoms.  

## 2020-09-26 ENCOUNTER — Encounter: Payer: Self-pay | Admitting: Emergency Medicine

## 2020-09-26 ENCOUNTER — Ambulatory Visit
Admission: EM | Admit: 2020-09-26 | Discharge: 2020-09-26 | Disposition: A | Discharge status: Home / Self Care | Payer: Medicaid Other | Attending: Emergency Medicine | Admitting: Emergency Medicine

## 2020-09-26 ENCOUNTER — Other Ambulatory Visit: Payer: Self-pay

## 2020-09-26 DIAGNOSIS — R350 Frequency of micturition: Secondary | ICD-10-CM

## 2020-09-26 DIAGNOSIS — R300 Dysuria: Secondary | ICD-10-CM | POA: Insufficient documentation

## 2020-09-26 DIAGNOSIS — M545 Low back pain, unspecified: Secondary | ICD-10-CM

## 2020-09-26 LAB — URINE CULTURE

## 2020-09-26 NOTE — ED Triage Notes (Signed)
Here for urine recollect

## 2020-09-28 LAB — URINE CULTURE: Culture: <10000 — AB

## 2021-05-21 ENCOUNTER — Emergency Department (HOSPITAL_COMMUNITY)
Admission: EM | Admit: 2021-05-21 | Discharge: 2021-05-21 | Disposition: A | Discharge status: Home / Self Care | Payer: Self-pay | Attending: Emergency Medicine | Admitting: Emergency Medicine

## 2021-05-21 ENCOUNTER — Other Ambulatory Visit: Payer: Self-pay

## 2021-05-21 ENCOUNTER — Emergency Department (HOSPITAL_COMMUNITY): Payer: Self-pay

## 2021-05-21 ENCOUNTER — Encounter (HOSPITAL_COMMUNITY): Payer: Self-pay | Admitting: *Deleted

## 2021-05-21 DIAGNOSIS — M25551 Pain in right hip: Secondary | ICD-10-CM | POA: Insufficient documentation

## 2021-05-21 DIAGNOSIS — M545 Low back pain, unspecified: Secondary | ICD-10-CM | POA: Insufficient documentation

## 2021-05-21 DIAGNOSIS — G8929 Other chronic pain: Secondary | ICD-10-CM | POA: Insufficient documentation

## 2021-05-21 DIAGNOSIS — R112 Nausea with vomiting, unspecified: Secondary | ICD-10-CM | POA: Insufficient documentation

## 2021-05-21 DIAGNOSIS — R350 Frequency of micturition: Secondary | ICD-10-CM | POA: Insufficient documentation

## 2021-05-21 LAB — CBC
HCT: 44.8 % (ref 36.0–46.0)
Hemoglobin: 15.3 g/dL — ABNORMAL HIGH (ref 12.0–15.0)
MCH: 33 pg (ref 26.0–34.0)
MCHC: 34.2 g/dL (ref 30.0–36.0)
MCV: 96.8 fL (ref 80.0–100.0)
Platelets: 310 10*3/uL (ref 150–400)
RBC: 4.63 MIL/uL (ref 3.87–5.11)
RDW: 13.2 % (ref 11.5–15.5)
WBC: 10.4 10*3/uL (ref 4.0–10.5)
nRBC: 0 % (ref 0.0–0.2)

## 2021-05-21 LAB — URINALYSIS, ROUTINE W REFLEX MICROSCOPIC
Bilirubin Urine: NEGATIVE
Glucose, UA: NEGATIVE mg/dL
Ketones, ur: NEGATIVE mg/dL
Nitrite: NEGATIVE
Protein, ur: NEGATIVE mg/dL
Specific Gravity, Urine: 1.011 (ref 1.005–1.030)
pH: 6 (ref 5.0–8.0)

## 2021-05-21 LAB — COMPREHENSIVE METABOLIC PANEL
ALT: 15 U/L (ref 0–44)
AST: 21 U/L (ref 15–41)
Albumin: 4.1 g/dL (ref 3.5–5.0)
Alkaline Phosphatase: 49 U/L (ref 38–126)
Anion gap: 6 (ref 5–15)
BUN: 6 mg/dL (ref 6–20)
CO2: 27 mmol/L (ref 22–32)
Calcium: 8.4 mg/dL — ABNORMAL LOW (ref 8.9–10.3)
Chloride: 105 mmol/L (ref 98–111)
Creatinine, Ser: 0.59 mg/dL (ref 0.44–1.00)
GFR, Estimated: >60 mL/min (ref >60)
Glucose, Bld: 77 mg/dL (ref 70–99)
Potassium: 3.5 mmol/L (ref 3.5–5.1)
Sodium: 138 mmol/L (ref 135–145)
Total Bilirubin: 0.7 mg/dL (ref 0.3–1.2)
Total Protein: 7.6 g/dL (ref 6.5–8.1)

## 2021-05-21 LAB — I-STAT BETA HCG BLOOD, ED (MC, WL, AP ONLY): I-stat hCG, quantitative: <5 m[IU]/mL (ref <5)

## 2021-05-21 LAB — LIPASE, BLOOD: Lipase: 35 U/L (ref 11–51)

## 2021-05-21 MED ORDER — ONDANSETRON 4 MG PO TBDP
4.0000 mg | ORAL_TABLET | Freq: Once | ORAL | Status: AC
  Administered 2021-05-21: 4 mg via ORAL
  Filled 2021-05-21: qty 1

## 2021-05-21 MED ORDER — METHOCARBAMOL 500 MG PO TABS: 500.0000 mg | ORAL_TABLET | Freq: Two times a day (BID) | ORAL | 0 refills | Status: DC

## 2021-05-21 MED ORDER — NITROFURANTOIN MONOHYD MACRO 100 MG PO CAPS: 100.0000 mg | ORAL_CAPSULE | Freq: Two times a day (BID) | ORAL | 0 refills | Status: AC

## 2021-05-21 NOTE — ED Notes (Signed)
Dc instructions and scripts reviewed with pt no questions or concerns at this time. Will follow up as needed ?

## 2021-05-21 NOTE — Discharge Instructions (Signed)
You were seen in the emergency department today for urinary frequency as well as right lower back pain.  While you are here we did a scan which did show that you had some small kidney stones however I do not believe this to be causing your pain.  Your back pain is likely musculoskeletal related to her scoliosis.  He did have a small amount of bacteria in your urine.  This is not a overt urinary infection however we will go ahead and treat you for 5 days for UTI to prevent pyelonephritis.  Please return to the emergency department for worsening pain with fevers.

## 2021-05-21 NOTE — ED Provider Notes (Signed)
[facility] DEPT Provider Note   CSN: [id number] Arrival date & time: 05/21/21  1028     History  Chief Complaint  Patient presents with   Back Pain   Emesis   Urinary Frequency    [name] is a 41 y.o. female.  With no pertinent past medical history presents to the emergency department because back pain.  Patient states that she has had 4 days of evolving right lower back pain.  She describes it as achy, intermittent, nonradiating.  She states that she has additionally had some questionable right lower quadrant abdominal pain but not consistently.  She denies any new trauma to her back.  She states that she has a history of scoliosis and has about a 1 inch difference in height on the right side.  She additionally states that she has some right hip pain that sometimes radiates up into her lower back as it is now.  She states that she has a history of pyelonephritis and was concerned that this was the same.  She states that she has cloudy urine however this is not unusual for her.  Additionally she reports some increased urinary urgency over the past 2 days.  She denies any malodorous urine, decreased urine output or dysuria.  Additionally she denies fevers.  She does state that she had an episode of nausea with vomiting last night.        Back Pain Associated symptoms: no abdominal pain, no dysuria and no fever   Emesis Associated symptoms: myalgias   Associated symptoms: no abdominal pain, no diarrhea and no fever   Urinary Frequency Pertinent negatives include no abdominal pain.      Home Medications Prior to Admission medications   Medication Sig Start Date End Date Taking? Authorizing Provider  ciprofloxacin (CIPRO) 500 MG tablet Take 1 tablet (500 mg total) by mouth 2 (two) times daily. 09/24/20   Faustino Congress, NP  lidocaine (LIDODERM) 5 % Place 1 patch onto the skin daily. Remove & Discard patch within 12 hours or as directed by MD  04/09/18   Petrucelli, Glynda Jaeger, PA-C  methocarbamol (ROBAXIN) 500 MG tablet Take 1 tablet (500 mg total) by mouth every 8 (eight) hours as needed. 04/09/18   Petrucelli, Samantha R, PA-C  naproxen (NAPROSYN) 500 MG tablet Take 1 tablet (500 mg total) by mouth 2 (two) times daily. 04/09/18   Petrucelli, Glynda Jaeger, PA-C      Allergies    Patient has no known allergies.    Review of Systems   Review of Systems  Constitutional:  Negative for appetite change and fever.  Gastrointestinal:  Positive for nausea and vomiting. Negative for abdominal pain and diarrhea.  Genitourinary:  Positive for frequency. Negative for decreased urine volume, difficulty urinating, dyspareunia and dysuria.  Musculoskeletal:  Positive for back pain and myalgias. Negative for gait problem.  All other systems reviewed and are negative.  Physical Exam Updated Vital Signs BP (!) 172/100 (BP Location: Left Arm)    Pulse 71    Temp 97.9 F (36.6 C)    Resp 16    Ht 5\' 6"  (1.676 m)    Wt 87.5 kg    SpO2 100%    BMI 31.15 kg/m  Physical Exam Vitals and nursing note reviewed.  Constitutional:      General: She is not in acute distress.    Appearance: Normal appearance. She is not ill-appearing or toxic-appearing.  HENT:     Head: Normocephalic  and atraumatic.     Nose: Nose normal.     Mouth/Throat:     Mouth: Mucous membranes are moist.     Pharynx: Oropharynx is clear.  Eyes:     General: No scleral icterus.    Pupils: Pupils are equal, round, and reactive to light.  Cardiovascular:     Pulses: Normal pulses.  Pulmonary:     Effort: Pulmonary effort is normal. No respiratory distress.  Abdominal:     General: There is no distension.     Palpations: Abdomen is soft.     Tenderness: There is no abdominal tenderness. There is no right CVA tenderness or left CVA tenderness.  Musculoskeletal:        General: Normal range of motion.     Cervical back: Normal range of motion.  Skin:    General: Skin is  warm and dry.     Capillary Refill: Capillary refill takes less than 2 seconds.     Findings: No rash.  Neurological:     General: No focal deficit present.     Mental Status: She is alert and oriented to person, place, and time. Mental status is at baseline.     Motor: No weakness.     Gait: Gait normal.  Psychiatric:        Mood and Affect: Mood normal.        Behavior: Behavior normal.        Thought Content: Thought content normal.        Judgment: Judgment normal.    ED Results / Procedures / Treatments   Labs (all labs ordered are listed, but only abnormal results are displayed) Labs Reviewed  COMPREHENSIVE METABOLIC PANEL - Abnormal; Notable for the following components:      Result Value   Calcium 8.4 (*)    All other components within normal limits  CBC - Abnormal; Notable for the following components:   Hemoglobin 15.3 (*)    All other components within normal limits  URINALYSIS, ROUTINE W REFLEX MICROSCOPIC - Abnormal; Notable for the following components:   Hgb urine dipstick MODERATE (*)    Leukocytes,Ua TRACE (*)    Bacteria, UA RARE (*)    All other components within normal limits  LIPASE, BLOOD  I-STAT BETA HCG BLOOD, ED ([facility], [facility], [facility] ONLY)   EKG None  Radiology CT Renal Stone Study  Result Date: 05/21/2021 CLINICAL DATA:  Flank pain, kidney stone suspected EXAM: CT ABDOMEN AND PELVIS WITHOUT CONTRAST TECHNIQUE: Multidetector CT imaging of the abdomen and pelvis was performed following the standard protocol without IV contrast. RADIATION DOSE REDUCTION: This exam was performed according to the departmental dose-optimization program which includes automated exposure control, adjustment of the mA and/or kV according to patient size and/or use of iterative reconstruction technique. COMPARISON:  Images from prior studies are unavailable at time dictation. FINDINGS: Lower chest: No acute abnormality. Hepatobiliary: No focal liver abnormality is seen. No gallstones,  gallbladder wall thickening, or biliary dilatation. Pancreas: Unremarkable. Spleen: Unremarkable. Adrenals/Urinary Tract: Adrenals are unremarkable. Minimal small bilateral nonobstructing renal calculi are present measuring up to 2 mm. Bladder is unremarkable. Stomach/Bowel: Stomach is within normal limits. Bowel is normal in caliber. Normal appendix. Vascular/Lymphatic: Minor calcified plaque.  No enlarged nodes. Reproductive: Uterus and bilateral adnexa are unremarkable. Other: No free fluid.  Abdominal wall is unremarkable. Musculoskeletal: No acute osseous abnormality. IMPRESSION: Minimal punctate nonobstructing renal calculi. No acute abnormality. Electronically Signed   By: Macy Mis M.D.   On:  05/21/2021 12:32    Procedures Procedures    Medications Ordered in ED Medications  ondansetron (ZOFRAN-ODT) disintegrating tablet 4 mg (4 mg Oral Given 05/21/21 1141)    ED Course/ Medical Decision Making/ A&P  Medical Decision Making This patient presents to the ED for concern of right-sided back pain, urinary frequency, this involves an extensive number of treatment options, and is a complaint that carries with it a high risk of complications and morbidity.  The differential diagnosis includes acute cystitis, pyelonephritis, nephrolithiasis, musculoskeletal pain    Co morbidities that complicate the patient evaluation  Scoliosis    Additional history obtained:  External records from outside source obtained and reviewed including previous ED and primary care visits in patient chart    Lab Tests:  I Ordered, and personally interpreted labs.   The pertinent results include:   CMP wnl  CBC wnl  Lipase wnl  I-stat hCG negative  UA with moderate blood, trace leukocytes, rare bacteria    Imaging Studies ordered:  I ordered imaging studies including CT Renal Stone Study   I independently visualized and interpreted imaging which showed minimal punctate nonobstructive renal  calculi I agree with the radiologist interpretation  Medicines ordered and prescription drug management:  I ordered medication including Zofran  for nausea  Reevaluation of the patient after these medicines showed that the patient improved I have reviewed the patients home medicines and have made adjustments as needed   Problem List / ED Course:  Back Pain Urinary frequency  41 year old female who presents to the emergency department with right-sided back pain and urinary frequency.  Physical exam is unremarkable.  She does not have any CVA tenderness.  She has mild soreness to palpation of the right paraspinal muscular space. As noted above labs are within normal limits except for leukocytes, bacteria and blood in urine.  CT renal stone study was obtained for the hematuria.  It noted minimal punctate bilateral renal calculi that are nonobstructing.  I doubt this is the source of her pain.  Her abdominal exam is unremarkable.  After work-up, likely there are 2 different things going on.  Patient has known history of scoliosis and low back pain.  I believe that her back pain is likely musculoskeletal in nature.  Her symptoms are not consistent with pyelonephritis or nephrolithiasis.  She is afebrile, no leukocytosis concerning for the pyelonephritis.  Stones are nondiagnostic causing symptoms at this time.  She is also previously had stones before and states that her symptoms right now are not similar to that.  Additionally, her urine is not overtly infected however she is having symptoms of UTI.  Reevaluation:  After the interventions noted above, I reevaluated the patient and found that they have :improved   Dispostion:  After consideration of the diagnostic results and the patients response to treatment, I feel that the patient would benefit from discharge with medication.  Given that she is having some symptoms of UTI in urine does have some leukocytes and bacteria we will go ahead and  treat for UTI.  Giving her 5 days of nitrofurantoin.  Additionally think her back pain is likely musculoskeletal in nature.  She has previously been prescribed Robaxin which has been helpful.  We will represcribed this medication for her to continue take over the next few days to improve her symptoms.  She is instructed to return to the emergency department should she have worsening fevers, urinary symptoms despite antibiotic use or worsening nausea or vomiting.  Discussed  all of this with patient who verbalized understanding and is agreeable to the plan.  Vital signs are stable.  She is safe for discharge.  Final Clinical Impression(s) / ED Diagnoses Final diagnoses:  Chronic right-sided low back pain without sciatica  Urinary frequency    Rx / DC Orders ED Discharge Orders          Ordered    methocarbamol (ROBAXIN) 500 MG tablet  2 times daily        05/21/21 1256    nitrofurantoin, macrocrystal-monohydrate, (MACROBID) 100 MG capsule  2 times daily        05/21/21 1256              Mickie Hillier, PA-C 05/21/21 1326    Valarie Merino, MD 05/22/21 1455

## 2021-05-21 NOTE — ED Triage Notes (Signed)
Low back pain with rt flank pain, Urine cloudy and has frequency. Pain woke pt from sleeep this morning.

## 2021-06-18 ENCOUNTER — Ambulatory Visit
Admission: EM | Admit: 2021-06-18 | Discharge: 2021-06-18 | Disposition: A | Discharge status: Home / Self Care | Payer: Self-pay | Attending: Family Medicine | Admitting: Family Medicine

## 2021-06-18 ENCOUNTER — Other Ambulatory Visit: Payer: Self-pay

## 2021-06-18 DIAGNOSIS — J069 Acute upper respiratory infection, unspecified: Secondary | ICD-10-CM | POA: Insufficient documentation

## 2021-06-18 DIAGNOSIS — J029 Acute pharyngitis, unspecified: Secondary | ICD-10-CM | POA: Insufficient documentation

## 2021-06-18 LAB — POCT RAPID STREP A (OFFICE): Rapid Strep A Screen: NEGATIVE

## 2021-06-18 MED ORDER — KETOROLAC TROMETHAMINE 60 MG/2ML IM SOLN
60.0000 mg | Freq: Once | INTRAMUSCULAR | Status: AC
  Administered 2021-06-18: 60 mg via INTRAMUSCULAR

## 2021-06-18 MED ORDER — DEXAMETHASONE SODIUM PHOSPHATE 10 MG/ML IJ SOLN
10.0000 mg | Freq: Once | INTRAMUSCULAR | Status: AC
  Administered 2021-06-18: 10 mg via INTRAMUSCULAR

## 2021-06-18 NOTE — ED Triage Notes (Signed)
Pt reports knot on left side of neck x 1 day;  nasal congestion, fever 101.0 F, sore throat and headache x 3 days.   Negative COVID test (home) yesterday.

## 2021-06-18 NOTE — Discharge Instructions (Signed)
Meds ordered this encounter  Medications   dexamethasone (DECADRON) injection 10 mg   ketorolac (TORADOL) injection 60 mg    

## 2021-06-21 LAB — CULTURE, GROUP A STREP (THRC)

## 2021-06-21 NOTE — ED Provider Notes (Signed)
[facility] CARE CENTER   [id number] 06/18/21 Arrival Time: 0818  ASSESSMENT & PLAN:  1. Sore throat   2. Viral URI    Watch lymph node on R neck. No signs of bacterial infection. Discussed typical duration of viral illnesses. Rapid strep negative. Culture sent. OTC symptom care as needed.  Meds ordered this encounter  Medications   dexamethasone (DECADRON) injection 10 mg   ketorolac (TORADOL) injection 60 mg     Follow-up Information     [facility] at [facility].   Specialty: Urgent Care Why: As needed. Contact information: [address] [place] [address] [phone]                Reviewed expectations re: course of current medical issues. Questions answered. Outlined signs and symptoms indicating need for more acute intervention. Understanding verbalized. After Visit Summary given.   SUBJECTIVE: History from: Patient. [name] is a 41 y.o. female. Reports: ST; Tmax 101 F; x 2-3 d; "feel a knot in my neck"; R. Denies: difficulty breathing. Tolerating PO intake without n/v/d.  OBJECTIVE:  Vitals:   06/18/21 0837  BP: (!) 136/91  Pulse: 99  Temp: 99 F (37.2 C)  TempSrc: Oral  SpO2: 98%    General appearance: alert; no distress Eyes: PERRLA; EOMI; conjunctiva normal HENT: [place]; AT; with nasal congestion; throat with erythema Neck: supple with small R-sided cervical enlarged lymph node; approx < 1 cm Lungs: speaks full sentences without difficulty; unlabored Extremities: no edema Skin: warm and dry Neurologic: normal gait Psychological: alert and cooperative; normal mood and affect  Labs: Results for orders placed or performed during the hospital encounter of 06/18/21  Culture, group A strep   Specimen: Throat  Result Value Ref Range   Specimen Description      THROAT Performed at [facility], [address] [place], [place] [address]    Special Requests      NONE Performed at [facility], [address], [place], [place] [address]    Culture      CULTURE REINCUBATED FOR BETTER GROWTH Performed at [facility], [address] [place], [place] [address]    Report Status PENDING   POCT rapid strep A  Result Value Ref Range   Rapid Strep A Screen Negative Negative   Labs Reviewed  CULTURE, GROUP A STREP [facility])  POCT RAPID STREP A (OFFICE)    No Known Allergies  History reviewed. No pertinent past medical history. Social History   Socioeconomic History   Marital status: Single    Spouse name: Not on file   Number of children: Not on file   Years of education: Not on file   Highest education level: Not on file  Occupational History   Not on file  Tobacco Use   Smoking status: Some Days    Packs/day: 0.50    Types: Cigarettes   Smokeless tobacco: Never  Vaping Use   Vaping Use: Never used  Substance and Sexual Activity   Alcohol use: Yes   Drug use: Yes    Types: Marijuana   Sexual activity: Yes    Birth control/protection: Surgical  Other Topics Concern   Not on file  Social History Narrative   Not on file   Social Determinants of Health   Financial Resource Strain: Not on file  Food Insecurity: Not on file  Transportation Needs: Not on file  Physical Activity: Not on file  Stress: Not on file  Social Connections: Not on file  Intimate Partner Violence: Not on file   History reviewed. No pertinent family history. Past Surgical History:  Procedure Laterality Date   TUBAL LIGATION       Mardella Layman, MD 06/21/21 1008

## 2022-05-21 DIAGNOSIS — M545 Low back pain, unspecified: Secondary | ICD-10-CM | POA: Diagnosis not present

## 2022-05-21 DIAGNOSIS — R109 Unspecified abdominal pain: Secondary | ICD-10-CM | POA: Diagnosis not present

## 2022-05-21 DIAGNOSIS — F17210 Nicotine dependence, cigarettes, uncomplicated: Secondary | ICD-10-CM | POA: Diagnosis not present

## 2022-05-21 DIAGNOSIS — N3000 Acute cystitis without hematuria: Secondary | ICD-10-CM | POA: Diagnosis not present

## 2022-08-11 ENCOUNTER — Ambulatory Visit (INDEPENDENT_AMBULATORY_CARE_PROVIDER_SITE_OTHER): Payer: Self-pay

## 2022-08-11 ENCOUNTER — Ambulatory Visit
Admission: EM | Admit: 2022-08-11 | Discharge: 2022-08-11 | Disposition: A | Discharge status: Home / Self Care | Payer: Self-pay | Attending: Family Medicine | Admitting: Family Medicine

## 2022-08-11 ENCOUNTER — Other Ambulatory Visit: Payer: Self-pay

## 2022-08-11 ENCOUNTER — Encounter: Payer: Self-pay | Admitting: Emergency Medicine

## 2022-08-11 DIAGNOSIS — R002 Palpitations: Secondary | ICD-10-CM | POA: Diagnosis not present

## 2022-08-11 DIAGNOSIS — R030 Elevated blood-pressure reading, without diagnosis of hypertension: Secondary | ICD-10-CM

## 2022-08-11 DIAGNOSIS — R079 Chest pain, unspecified: Secondary | ICD-10-CM

## 2022-08-11 DIAGNOSIS — R0789 Other chest pain: Secondary | ICD-10-CM

## 2022-08-11 NOTE — ED Triage Notes (Addendum)
Pt reports intermittent palpitations,shortness of breath, dizziness,fatigue x2 months. Pt reports initial episode was 1st time using vape. Pt reports stopped use of vape and reports ever since "butterfly feeling in chest" is happening more frequently sometimes with exertion and others while at rest. Pt reports family history of cardiac issues.   Pt reports weight on chest and shortness of breath at this time.   Pt inquired about pcp. Pt was able to set up pcp appt while in triage.

## 2022-08-11 NOTE — ED Provider Notes (Signed)
[facility]    CSN: [id number] Arrival date & time: 08/11/22  1305      History   Chief Complaint Chief Complaint  Patient presents with   Palpitations    HPI [name] is a 42 y.o. female.   Patient presenting today with 40-month history of intermittent palpitations, shortness of breath, fatigue, chest tightness, left chest pain.  States symptoms seem to be getting worse the last week or so.  States symptoms seem to start after she began using a vape for the first time, has been a longtime cigarette smoker and was trying to quit.  Use the vape for about a week and a half before discontinuing for fear that it was making her symptoms worse.  Symptoms have persisted since and seem to be worse when she is exerting herself.  Denies fever, diaphoresis, nausea, vomiting, jaw or left arm pain.  No personal history of chronic pulmonary or chronic cardiac disease but does have heart disease running in her family.  So far not trying any medications for symptoms.     History reviewed. No pertinent past medical history.  There are no problems to display for this patient.   Past Surgical History:  Procedure Laterality Date   TUBAL LIGATION      OB History   No obstetric history on file.      Home Medications    Prior to Admission medications   Medication Sig Start Date End Date Taking? Authorizing Provider  ELDERBERRY PO Take by mouth.    [provider]  VITAMIN D PO Take by mouth.    [provider]  Zinc Sulfate (ZINC 15 PO) Take by mouth.    [provider]    Family History History reviewed. No pertinent family history.  Social History Social History   Tobacco Use   Smoking status: Some Days    Packs/day: .25    Types: Cigarettes   Smokeless tobacco: Never  Vaping Use   Vaping Use: Former  Substance Use Topics   Alcohol use: Yes    Alcohol/week: 5.0 standard drinks of alcohol    Types: 5 Shots of liquor per week     Comment: daily   Drug use: Yes    Types: Marijuana    Comment: occ; last use a few days ago.     Allergies   Patient has no known allergies.   Review of Systems Review of Systems Per HPI  Physical Exam Triage Vital Signs ED Triage Vitals  Enc Vitals Group     BP 08/11/22 1317 (!) 149/94     Pulse Rate 08/11/22 1317 87     Resp 08/11/22 1317 20     Temp 08/11/22 1317 98.6 F (37 C)     Temp Source 08/11/22 1317 Oral     SpO2 08/11/22 1317 99 %     Weight --      Height --      Head Circumference --      Peak Flow --      Pain Score 08/11/22 1318 0     Pain Loc --      Pain Edu? --      Excl. in [place]? --    No data found.  Updated Vital Signs BP (!) 149/94 (BP Location: Right Arm)   Pulse 87   Temp 98.6 F (37 C) (Oral)   Resp 20   LMP 07/29/2022 (Approximate)   SpO2 99%   Visual Acuity Right  Eye Distance:   Left Eye Distance:   Bilateral Distance:    Right Eye Near:   Left Eye Near:    Bilateral Near:     Physical Exam Vitals and nursing note reviewed.  Constitutional:      Appearance: Normal appearance. She is not ill-appearing.  HENT:     Head: Atraumatic.     Mouth/Throat:     Mouth: Mucous membranes are moist.  Eyes:     Extraocular Movements: Extraocular movements intact.     Conjunctiva/sclera: Conjunctivae normal.  Cardiovascular:     Rate and Rhythm: Normal rate and regular rhythm.     Heart sounds: Normal heart sounds.  Pulmonary:     Effort: Pulmonary effort is normal.     Breath sounds: Normal breath sounds. No wheezing or rales.  Musculoskeletal:        General: Normal range of motion.     Cervical back: Normal range of motion and neck supple.  Skin:    General: Skin is warm and dry.  Neurological:     Mental Status: She is alert and oriented to person, place, and time.     Motor: No weakness.     Gait: Gait normal.  Psychiatric:        Mood and Affect: Mood normal.        Thought Content: Thought content normal.         Judgment: Judgment normal.    UC Treatments / Results  Labs (all labs ordered are listed, but only abnormal results are displayed) Labs Reviewed  CBC WITH DIFFERENTIAL/PLATELET  COMPREHENSIVE METABOLIC PANEL  TSH    EKG   Radiology DG Chest 2 View  Result Date: 08/11/2022 CLINICAL DATA:  Worsening chest tightness EXAM: CHEST - 2 VIEW COMPARISON:  CXR 04/13/11 FINDINGS: No pleural effusion. No pneumothorax. No focal airspace opacity. Normal cardiac and mediastinal contours. No radiographically apparent displaced rib fractures. Visualized upper abdomen is unremarkable. Vertebral body heights are maintained. IMPRESSION: No active cardiopulmonary disease. Electronically Signed   By: Marin Roberts M.D.   On: 08/11/2022 14:38    Procedures Procedures (including critical care time)  Medications Ordered in UC Medications - No data to display  Initial Impression / Assessment and Plan / UC Course  I have reviewed the triage vital signs and the nursing notes.  Pertinent labs & imaging results that were available during my care of the patient were reviewed by me and considered in my medical decision making (see chart for details).     Mildly hypertensive in triage, otherwise vital signs very reassuring today.  Exam revealing no abnormalities and she appears in no acute distress.  EKG today showing normal sinus rhythm at 80 bpm without acute ST or T wave changes.  Chest x-ray without acute cardiopulmonary abnormalities, labs pending for further rule out.  Reassurance given, and assistance with primary care appointment given today.  This is scheduled for the end of this month.  Discussed reducing smoking is much as possible, exercise, healthy lifestyle and close PCP follow-up once established.  Follow-up sooner for significantly worsening symptoms.  Final Clinical Impressions(s) / UC Diagnoses   Final diagnoses:  Palpitations  Chest tightness  Elevated blood pressure reading   Discharge  Instructions   None    ED Prescriptions   None    PDMP not reviewed this encounter.   Volney American, [place] 08/11/22 [phone]

## 2022-08-12 LAB — CBC WITH DIFFERENTIAL/PLATELET
Basophils Absolute: 0.1 10*3/uL (ref 0.0–0.2)
Basos: 1 %
EOS (ABSOLUTE): 0.2 10*3/uL (ref 0.0–0.4)
Eos: 2 %
Hematocrit: 43.7 % (ref 34.0–46.6)
Hemoglobin: 14.9 g/dL (ref 11.1–15.9)
Immature Grans (Abs): 0 10*3/uL (ref 0.0–0.1)
Immature Granulocytes: 0 %
Lymphocytes Absolute: 2.7 10*3/uL (ref 0.7–3.1)
Lymphs: 28 %
MCH: 33.6 pg — ABNORMAL HIGH (ref 26.6–33.0)
MCHC: 34.1 g/dL (ref 31.5–35.7)
MCV: 99 fL — ABNORMAL HIGH (ref 79–97)
Monocytes Absolute: 0.6 10*3/uL (ref 0.1–0.9)
Monocytes: 7 %
Neutrophils Absolute: 6.1 10*3/uL (ref 1.4–7.0)
Neutrophils: 62 %
Platelets: 275 10*3/uL (ref 150–450)
RBC: 4.43 x10E6/uL (ref 3.77–5.28)
RDW: 12.4 % (ref 11.7–15.4)
WBC: 9.8 10*3/uL (ref 3.4–10.8)

## 2022-08-12 LAB — COMPREHENSIVE METABOLIC PANEL
ALT: 17 IU/L (ref 0–32)
AST: 22 IU/L (ref 0–40)
Albumin/Globulin Ratio: 1.5 (ref 1.2–2.2)
Albumin: 4.5 g/dL (ref 3.9–4.9)
Alkaline Phosphatase: 70 IU/L (ref 44–121)
BUN/Creatinine Ratio: 10 (ref 9–23)
BUN: 7 mg/dL (ref 6–24)
Bilirubin Total: 0.8 mg/dL (ref 0.0–1.2)
CO2: 23 mmol/L (ref 20–29)
Calcium: 9.6 mg/dL (ref 8.7–10.2)
Chloride: 99 mmol/L (ref 96–106)
Creatinine, Ser: 0.69 mg/dL (ref 0.57–1.00)
Globulin, Total: 3 g/dL (ref 1.5–4.5)
Glucose: 81 mg/dL (ref 70–99)
Potassium: 3.5 mmol/L (ref 3.5–5.2)
Sodium: 136 mmol/L (ref 134–144)
Total Protein: 7.5 g/dL (ref 6.0–8.5)
eGFR: 110 mL/min/{1.73_m2} (ref >59)

## 2022-08-12 LAB — TSH: TSH: 1.64 u[IU]/mL (ref 0.450–4.500)

## 2022-09-08 ENCOUNTER — Encounter: Payer: Self-pay | Admitting: Family Medicine

## 2022-09-08 ENCOUNTER — Ambulatory Visit (INDEPENDENT_AMBULATORY_CARE_PROVIDER_SITE_OTHER): Payer: BC Managed Care – PPO | Admitting: Family Medicine

## 2022-09-08 VITALS — BP 126/78 | HR 83 | Ht 66.0 in | Wt 178.1 lb

## 2022-09-08 DIAGNOSIS — E7849 Other hyperlipidemia: Secondary | ICD-10-CM

## 2022-09-08 DIAGNOSIS — E559 Vitamin D deficiency, unspecified: Secondary | ICD-10-CM

## 2022-09-08 DIAGNOSIS — E038 Other specified hypothyroidism: Secondary | ICD-10-CM | POA: Diagnosis not present

## 2022-09-08 DIAGNOSIS — R030 Elevated blood-pressure reading, without diagnosis of hypertension: Secondary | ICD-10-CM

## 2022-09-08 DIAGNOSIS — R21 Rash and other nonspecific skin eruption: Secondary | ICD-10-CM

## 2022-09-08 DIAGNOSIS — R7301 Impaired fasting glucose: Secondary | ICD-10-CM

## 2022-09-08 DIAGNOSIS — I10 Essential (primary) hypertension: Secondary | ICD-10-CM | POA: Insufficient documentation

## 2022-09-08 NOTE — Patient Instructions (Addendum)
  I appreciate the opportunity to provide care to you today!    Follow up:  2 weeks for BP and Pap smear  Labs: please stop by the lab today to get your blood drawn (CBC, CMP, TSH, Lipid profile, HgA1c, Vit D)    Please continue to a heart-healthy diet and increase your physical activities. Try to exercise for at least five days a week.      It was a pleasure to see you and I look forward to continuing to work together on your health and well-being. Please do not hesitate to call the office if you need care or have questions about your care.   Have a wonderful day and week. With Gratitude, Gilmore Laroche MSN, FNP-BC

## 2022-09-08 NOTE — Assessment & Plan Note (Signed)
Brown asymptomatic patches noted on the feet bilaterally Onset of symptoms 2 to 3 months ago No variation in color within the spots, irregular borders, rapid growth with changes in appearance since onset, no pain or discomfort reported We will continue to monitor Rash likely benign

## 2022-09-08 NOTE — Progress Notes (Signed)
New Patient Office Visit  Subjective:  Patient ID: [name], female    DOB: 03-07-1981  Age: 42 y.o. MRN: [id number]  CC:  Chief Complaint  Patient presents with   New Patient (Initial Visit)    Establishing care. Pt would like to discuss blood pressure concerns. Reports small case of scoliosis has back pain. Pt reports knots on her back. Pt reports skin concerns on her foot.     HPI [name] is a 42 y.o. female with past medical history of tobacco use presents for establishing care. For the details of today's visit, please refer to the assessment and plan.     History reviewed. No pertinent past medical history.  Past Surgical History:  Procedure Laterality Date   TUBAL LIGATION      History reviewed. No pertinent family history.  Social History   Socioeconomic History   Marital status: Single    Spouse name: Not on file   Number of children: Not on file   Years of education: Not on file   Highest education level: Not on file  Occupational History   Not on file  Tobacco Use   Smoking status: Some Days    Packs/day: .25    Types: Cigarettes   Smokeless tobacco: Never   Tobacco comments:    6 a day  Vaping Use   Vaping Use: Former  Substance and Sexual Activity   Alcohol use: Yes    Alcohol/week: 5.0 standard drinks of alcohol    Types: 5 Shots of liquor per week    Comment: daily   Drug use: Yes    Types: Marijuana    Comment: occ; last use a few days ago.   Sexual activity: Yes    Birth control/protection: Surgical  Other Topics Concern   Not on file  Social History Narrative   Not on file   Social Determinants of Health   Financial Resource Strain: Not on file  Food Insecurity: Not on file  Transportation Needs: Not on file  Physical Activity: Not on file  Stress: Not on file  Social Connections: Not on file  Intimate Partner Violence: Not on file    ROS Review of Systems  Constitutional:  Negative for chills and fever.  Eyes:   Negative for visual disturbance.  Respiratory:  Negative for chest tightness and shortness of breath.   Skin:  Positive for rash.  Neurological:  Negative for dizziness and headaches.    Objective:   Today's Vitals: BP 126/78   Pulse 83   Ht 5\' 6"  (1.676 m)   Wt 178 lb 1.9 oz (80.8 kg)   LMP 07/29/2022 (Approximate)   SpO2 97%   BMI 28.75 kg/m   Physical Exam HENT:     Head: Normocephalic.     Mouth/Throat:     Mouth: Mucous membranes are moist.  Cardiovascular:     Rate and Rhythm: Normal rate.     Heart sounds: Normal heart sounds.  Pulmonary:     Effort: Pulmonary effort is normal.     Breath sounds: Normal breath sounds.  Skin:    Findings: Rash (brown patches on feet) present.  Neurological:     Mental Status: She is alert.      Assessment & Plan:   Elevated BP without diagnosis of hypertension Assessment & Plan: Reports elevated BP in the 140s systolics in the 90s diastolic ambulatory pulmonary Asymptomatic today in the clinic BP controlled in the clinic Encouraged to assess ambulatory  readings daily for 2 weeks and bring readings with her at her next appointment Low-sodium diet with increased physical activity encourage We will follow-up in 2 weeks     Rash in adult Assessment & Plan: Manson Passey asymptomatic patches noted on the feet bilaterally Onset of symptoms 2 to 3 months ago No variation in color within the spots, irregular borders, rapid growth with changes in appearance since onset, no pain or discomfort reported We will continue to monitor Rash likely benign   Vitamin D deficiency -     VITAMIN D 25 Hydroxy (Vit-D Deficiency, Fractures)  Impaired fasting blood sugar -     Hemoglobin A1c  Other hyperlipidemia -     CBC with Differential/Platelet -     CMP14+EGFR -     Lipid panel  Other specified hypothyroidism -     TSH + free T4  Muscle knot: Likely due to muscle spasm.  Encouraged daily stretching exercises and heat applications to  the affected site.  Will continue to monitor.  Declines muscle relaxant today.   Note: This chart has been completed using Engineer, civil (consulting) software, and while attempts have been made to ensure accuracy, certain words and phrases may not be transcribed as intended.    Follow-up: Return in about 2 weeks (around 09/22/2022) for pap smear, BP.   Gilmore Laroche, FNP

## 2022-09-08 NOTE — Assessment & Plan Note (Addendum)
Reports elevated BP in the 140s systolics in the 90s diastolic ambulatory pulmonary Asymptomatic today in the clinic BP controlled in the clinic Encouraged to assess ambulatory readings daily for 2 weeks and bring readings with her at her next appointment Low-sodium diet with increased physical activity encourage We will follow-up in 2 weeks

## 2022-09-09 LAB — CBC WITH DIFFERENTIAL/PLATELET
Basophils Absolute: 0.1 10*3/uL (ref 0.0–0.2)
Basos: 1 %
EOS (ABSOLUTE): 0.2 10*3/uL (ref 0.0–0.4)
Eos: 2 %
Hematocrit: 45 % (ref 34.0–46.6)
Hemoglobin: 15.2 g/dL (ref 11.1–15.9)
Immature Grans (Abs): 0 10*3/uL (ref 0.0–0.1)
Immature Granulocytes: 0 %
Lymphocytes Absolute: 1.9 10*3/uL (ref 0.7–3.1)
Lymphs: 20 %
MCH: 33 pg (ref 26.6–33.0)
MCHC: 33.8 g/dL (ref 31.5–35.7)
MCV: 98 fL — ABNORMAL HIGH (ref 79–97)
Monocytes Absolute: 0.5 10*3/uL (ref 0.1–0.9)
Monocytes: 6 %
Neutrophils Absolute: 6.7 10*3/uL (ref 1.4–7.0)
Neutrophils: 71 %
Platelets: 266 10*3/uL (ref 150–450)
RBC: 4.61 x10E6/uL (ref 3.77–5.28)
RDW: 12.4 % (ref 11.7–15.4)
WBC: 9.4 10*3/uL (ref 3.4–10.8)

## 2022-09-09 LAB — CMP14+EGFR
ALT: 17 IU/L (ref 0–32)
AST: 20 IU/L (ref 0–40)
Albumin/Globulin Ratio: 1.5 (ref 1.2–2.2)
Albumin: 4.3 g/dL (ref 3.9–4.9)
Alkaline Phosphatase: 65 IU/L (ref 44–121)
BUN/Creatinine Ratio: 7 — ABNORMAL LOW (ref 9–23)
BUN: 6 mg/dL (ref 6–24)
Bilirubin Total: 0.9 mg/dL (ref 0.0–1.2)
CO2: 23 mmol/L (ref 20–29)
Calcium: 9.8 mg/dL (ref 8.7–10.2)
Chloride: 101 mmol/L (ref 96–106)
Creatinine, Ser: 0.88 mg/dL (ref 0.57–1.00)
Globulin, Total: 2.9 g/dL (ref 1.5–4.5)
Glucose: 85 mg/dL (ref 70–99)
Potassium: 4.2 mmol/L (ref 3.5–5.2)
Sodium: 139 mmol/L (ref 134–144)
Total Protein: 7.2 g/dL (ref 6.0–8.5)
eGFR: 83 mL/min/{1.73_m2} (ref >59)

## 2022-09-09 LAB — LIPID PANEL
Chol/HDL Ratio: 2.3 ratio (ref 0.0–4.4)
Cholesterol, Total: 185 mg/dL (ref 100–199)
HDL: 81 mg/dL (ref >39)
LDL Chol Calc (NIH): 87 mg/dL (ref 0–99)
Triglycerides: 93 mg/dL (ref 0–149)
VLDL Cholesterol Cal: 17 mg/dL (ref 5–40)

## 2022-09-09 LAB — HEMOGLOBIN A1C
Est. average glucose Bld gHb Est-mCnc: 94 mg/dL
Hgb A1c MFr Bld: 4.9 % (ref 4.8–5.6)

## 2022-09-09 LAB — TSH+FREE T4
Free T4: 1.17 ng/dL (ref 0.82–1.77)
TSH: 1.7 u[IU]/mL (ref 0.450–4.500)

## 2022-09-09 LAB — VITAMIN D 25 HYDROXY (VIT D DEFICIENCY, FRACTURES): Vit D, 25-Hydroxy: 8.9 ng/mL — ABNORMAL LOW (ref 30.0–100.0)

## 2022-09-13 ENCOUNTER — Other Ambulatory Visit: Payer: Self-pay | Admitting: Family Medicine

## 2022-09-13 DIAGNOSIS — E559 Vitamin D deficiency, unspecified: Secondary | ICD-10-CM

## 2022-09-13 MED ORDER — VITAMIN D (ERGOCALCIFEROL) 1.25 MG (50000 UNIT) PO CAPS: 50000.0000 [IU] | ORAL_CAPSULE | ORAL | 1 refills | Status: DC

## 2022-09-30 ENCOUNTER — Ambulatory Visit (INDEPENDENT_AMBULATORY_CARE_PROVIDER_SITE_OTHER): Payer: BC Managed Care – PPO | Admitting: Family Medicine

## 2022-09-30 ENCOUNTER — Other Ambulatory Visit (HOSPITAL_COMMUNITY)
Admission: RE | Admit: 2022-09-30 | Discharge: 2022-09-30 | Disposition: A | Discharge status: Home / Self Care | Payer: BC Managed Care – PPO | Source: Ambulatory Visit | Attending: Family Medicine | Admitting: Family Medicine

## 2022-09-30 ENCOUNTER — Encounter: Payer: Self-pay | Admitting: Family Medicine

## 2022-09-30 VITALS — BP 140/90 | HR 84 | Ht 66.0 in | Wt 182.0 lb

## 2022-09-30 DIAGNOSIS — Z124 Encounter for screening for malignant neoplasm of cervix: Secondary | ICD-10-CM | POA: Insufficient documentation

## 2022-09-30 DIAGNOSIS — I10 Essential (primary) hypertension: Secondary | ICD-10-CM | POA: Diagnosis not present

## 2022-09-30 DIAGNOSIS — E559 Vitamin D deficiency, unspecified: Secondary | ICD-10-CM | POA: Diagnosis not present

## 2022-09-30 DIAGNOSIS — Z1231 Encounter for screening mammogram for malignant neoplasm of breast: Secondary | ICD-10-CM

## 2022-09-30 MED ORDER — VITAMIN D (ERGOCALCIFEROL) 1.25 MG (50000 UNIT) PO CAPS: 50000.0000 [IU] | ORAL_CAPSULE | ORAL | 1 refills | Status: DC

## 2022-09-30 MED ORDER — OLMESARTAN MEDOXOMIL 20 MG PO TABS: 20.0000 mg | ORAL_TABLET | Freq: Every day | ORAL | 1 refills | Status: DC

## 2022-09-30 NOTE — Patient Instructions (Signed)
I appreciate the opportunity to provide care to you today!    Follow up:  2 weeks   Labs: next visit   Please start taking Olmesartan 20 mg daily  .Take each dose at the same time each day, with or without food High blood pressure often has no symptoms; it is important to take your medication as prescribed In the first few days of therapy, you may experience dizziness/lightheadedness from your body adjusting to lower blood pressure (this is expected) Please stay well-hydrated, at least 64 ounces of water daily I recommend a low-sodium diet, less than 1500 mg daily, with increased physical activity Avoid using over-the-counter NSAIDs (ibuprofen, naproxen) while on this therapy Avoid using sodium use in your diet Please increase your servings of fruits and vegetables    Please continue to a heart-healthy diet and increase your physical activities. Try to exercise for at least five days a week.      It was a pleasure to see you and I look forward to continuing to work together on your health and well-being. Please do not hesitate to call the office if you need care or have questions about your care.   Have a wonderful day and week. With Gratitude, Gilmore Laroche MSN, FNP-BC

## 2022-09-30 NOTE — Assessment & Plan Note (Signed)
Cytology and HPV co-testing (preferred) every 5 years or cytology alone (acceptable) every 3 years. - Pap due 2029   

## 2022-09-30 NOTE — Progress Notes (Signed)
Established Patient Office Visit  Subjective:  Patient ID: [name], female    DOB: 28-Feb-1981  Age: 42 y.o. MRN: [id number]  CC:  Chief Complaint  Patient presents with   Gynecologic Exam    Pap    Hypertension    Pt reports blood pressure fluctuating up and down. Has concerns about this.    HPI [name] is a 42 y.o. female with past medical history of pyelonephritis presents for a Pap smear examination.  For the details of today's visit, please refer to the assessment and plan.    History reviewed. No pertinent past medical history.  Past Surgical History:  Procedure Laterality Date   TUBAL LIGATION      History reviewed. No pertinent family history.  Social History   Socioeconomic History   Marital status: Single    Spouse name: Not on file   Number of children: Not on file   Years of education: Not on file   Highest education level: Not on file  Occupational History   Not on file  Tobacco Use   Smoking status: Some Days    Packs/day: .25    Types: Cigarettes   Smokeless tobacco: Never   Tobacco comments:    6 a day  Vaping Use   Vaping Use: Former  Substance and Sexual Activity   Alcohol use: Yes    Alcohol/week: 5.0 standard drinks of alcohol    Types: 5 Shots of liquor per week    Comment: daily   Drug use: Yes    Types: Marijuana    Comment: occ; last use a few days ago.   Sexual activity: Yes    Birth control/protection: Surgical  Other Topics Concern   Not on file  Social History Narrative   Not on file   Social Determinants of Health   Financial Resource Strain: Not on file  Food Insecurity: Not on file  Transportation Needs: Not on file  Physical Activity: Not on file  Stress: Not on file  Social Connections: Not on file  Intimate Partner Violence: Not on file    Outpatient Medications Prior to Visit  Medication Sig Dispense Refill   ELDERBERRY PO Take by mouth. (Patient not taking: Reported on 09/30/2022)     VITAMIN D  PO Take by mouth. (Patient not taking: Reported on 09/30/2022)     Zinc Sulfate (ZINC 15 PO) Take by mouth. (Patient not taking: Reported on 09/30/2022)     Vitamin D, Ergocalciferol, (DRISDOL) 1.25 MG (50000 UNIT) CAPS capsule Take 1 capsule (50,000 Units total) by mouth every 7 (seven) days. (Patient not taking: Reported on 09/30/2022) 20 capsule 1   No facility-administered medications prior to visit.    No Known Allergies  ROS Review of Systems  Constitutional:  Negative for chills and fever.  Eyes:  Negative for visual disturbance.  Respiratory:  Negative for chest tightness and shortness of breath.   Genitourinary:  Negative for decreased urine volume, vaginal bleeding, vaginal discharge and vaginal pain.  Neurological:  Negative for dizziness and headaches.      Objective:    Physical Exam HENT:     Head: Normocephalic.     Mouth/Throat:     Mouth: Mucous membranes are moist.  Cardiovascular:     Rate and Rhythm: Normal rate.     Heart sounds: Normal heart sounds.  Pulmonary:     Effort: Pulmonary effort is normal.     Breath sounds: Normal breath sounds.  Genitourinary:  Exam position: Lithotomy position.     Tanner stage (genital): 5.     Comments: Vaginal wall: pink and rugated, smooth and non-tender; absence of lesions, edema, and erythema. Labia Majora and Minora: present bilaterally, moist, soft tissue, and homogeneous; free of edema and ulcerations. Clitoris is anatomically present, above the urethral, and free of lesions, masses, and ulceration.    Neurological:     Mental Status: She is alert.     BP (!) 140/90 (BP Location: Left Arm)   Pulse 84   Ht 5\' 6"  (1.676 m)   Wt 182 lb (82.6 kg)   SpO2 94%   BMI 29.38 kg/m  Wt Readings from Last 3 Encounters:  09/30/22 182 lb (82.6 kg)  09/08/22 178 lb 1.9 oz (80.8 kg)  05/21/21 193 lb (87.5 kg)    Lab Results  Component Value Date   TSH 1.700 09/08/2022   Lab Results  Component Value Date   WBC  9.4 09/08/2022   HGB 15.2 09/08/2022   HCT 45.0 09/08/2022   MCV 98 (H) 09/08/2022   PLT 266 09/08/2022   Lab Results  Component Value Date   NA 139 09/08/2022   K 4.2 09/08/2022   CO2 23 09/08/2022   GLUCOSE 85 09/08/2022   BUN 6 09/08/2022   CREATININE 0.88 09/08/2022   BILITOT 0.9 09/08/2022   ALKPHOS 65 09/08/2022   AST 20 09/08/2022   ALT 17 09/08/2022   PROT 7.2 09/08/2022   ALBUMIN 4.3 09/08/2022   CALCIUM 9.8 09/08/2022   ANIONGAP 6 05/21/2021   EGFR 83 09/08/2022   Lab Results  Component Value Date   CHOL 185 09/08/2022   Lab Results  Component Value Date   HDL 81 09/08/2022   Lab Results  Component Value Date   LDLCALC 87 09/08/2022   Lab Results  Component Value Date   TRIG 93 09/08/2022   Lab Results  Component Value Date   CHOLHDL 2.3 09/08/2022   Lab Results  Component Value Date   HGBA1C 4.9 09/08/2022      Assessment & Plan:  Cervical cancer screening Assessment & Plan: -Cytology and HPV co-testing (preferred) every 5 years or cytology alone (acceptable) every 3 years. - Pap due 2029    Orders: -     Cytology - PAP  Primary hypertension Assessment & Plan: Uncontrolled Ambulatory readings in the 140s systolic and 80s diastolic Asymptomatic in the clinic Will start the patient on olmesartan 20 mg today Low-sodium diet with increase physical activity encourage Will follow-up on BP in 2 weeks BP Readings from Last 3 Encounters:  09/30/22 (!) 140/90  09/08/22 126/78  08/11/22 (!) 149/94     Orders: -     Olmesartan Medoxomil; Take 1 tablet (20 mg total) by mouth daily.  Dispense: 30 tablet; Refill: 1  Vitamin D deficiency -     Vitamin D (Ergocalciferol); Take 1 capsule (50,000 Units total) by mouth every 7 (seven) days.  Dispense: 20 capsule; Refill: 1  Breast cancer screening by mammogram -     3D Screening Mammogram, Left and Right    Follow-up: No follow-ups on file.   Gilmore Laroche, FNP

## 2022-09-30 NOTE — Assessment & Plan Note (Signed)
Uncontrolled Ambulatory readings in the 140s systolic and 80s diastolic Asymptomatic in the clinic Will start the patient on olmesartan 20 mg today Low-sodium diet with increase physical activity encourage Will follow-up on BP in 2 weeks BP Readings from Last 3 Encounters:  09/30/22 (!) 140/90  09/08/22 126/78  08/11/22 (!) 149/94

## 2022-10-07 LAB — CYTOLOGY - PAP
Adequacy: ABSENT
Comment: NEGATIVE
Diagnosis: NEGATIVE
Diagnosis: REACTIVE
High risk HPV: NEGATIVE

## 2022-10-07 NOTE — Progress Notes (Signed)
Please inform the patient that her pap smear was negative for abnormal growth or malignancy on her cervix.

## 2022-10-12 ENCOUNTER — Ambulatory Visit (HOSPITAL_COMMUNITY): Payer: BC Managed Care – PPO

## 2022-10-13 ENCOUNTER — Telehealth: Payer: Self-pay | Admitting: Family Medicine

## 2022-10-13 NOTE — Telephone Encounter (Signed)
Pt called in regard to BP / BP med States that when she takes med sometimes bp is low  Wants to know if she need to take med or if med is too strong.  Current bp 117/80  Patient wants a call back in regard.

## 2022-10-13 NOTE — Telephone Encounter (Signed)
Spoke to pt states after taking her bp medication she felt better, has concerns if this could be anxiety related since she has just started a new job and dealing with life stressors also, states she has an appt on Thursday will further discuss this with you at visit.

## 2022-10-14 ENCOUNTER — Ambulatory Visit (HOSPITAL_COMMUNITY)
Admission: RE | Admit: 2022-10-14 | Discharge: 2022-10-14 | Disposition: A | Discharge status: Home / Self Care | Payer: BC Managed Care – PPO | Source: Ambulatory Visit | Attending: Family Medicine | Admitting: Family Medicine

## 2022-10-14 ENCOUNTER — Encounter (HOSPITAL_COMMUNITY): Payer: Self-pay

## 2022-10-14 DIAGNOSIS — Z1231 Encounter for screening mammogram for malignant neoplasm of breast: Secondary | ICD-10-CM | POA: Diagnosis not present

## 2022-10-15 ENCOUNTER — Ambulatory Visit (INDEPENDENT_AMBULATORY_CARE_PROVIDER_SITE_OTHER): Payer: BC Managed Care – PPO | Admitting: Family Medicine

## 2022-10-15 ENCOUNTER — Other Ambulatory Visit (HOSPITAL_COMMUNITY): Payer: Self-pay | Admitting: Family Medicine

## 2022-10-15 ENCOUNTER — Encounter: Payer: Self-pay | Admitting: Family Medicine

## 2022-10-15 VITALS — BP 134/82 | HR 81 | Ht 66.0 in | Wt 179.1 lb

## 2022-10-15 DIAGNOSIS — I10 Essential (primary) hypertension: Secondary | ICD-10-CM | POA: Diagnosis not present

## 2022-10-15 DIAGNOSIS — F319 Bipolar disorder, unspecified: Secondary | ICD-10-CM | POA: Diagnosis not present

## 2022-10-15 DIAGNOSIS — F430 Acute stress reaction: Secondary | ICD-10-CM | POA: Diagnosis not present

## 2022-10-15 DIAGNOSIS — F410 Panic disorder [episodic paroxysmal anxiety] without agoraphobia: Secondary | ICD-10-CM

## 2022-10-15 DIAGNOSIS — R928 Other abnormal and inconclusive findings on diagnostic imaging of breast: Secondary | ICD-10-CM

## 2022-10-15 MED ORDER — CLONAZEPAM 0.5 MG PO TABS
0.5000 mg | ORAL_TABLET | ORAL | 0 refills | Status: DC | PRN
Start: 2022-10-15 — End: 2022-11-27

## 2022-10-15 NOTE — Assessment & Plan Note (Signed)
Controlled Asymptomatic today in the clinic She reports compliance with olmesartan 20 mg daily Low-sodium diet with increased physical activity encouraged BP Readings from Last 3 Encounters:  10/15/22 134/82  09/30/22 (!) 140/90  09/08/22 126/78

## 2022-10-15 NOTE — Assessment & Plan Note (Signed)
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

## 2022-10-15 NOTE — Assessment & Plan Note (Signed)
The patient reports history of bipolar and was diagnosed 20 years ago The medication taken is Abilify She has not been on therapy since She reports feeling full of energy with less and 2 hours of sleep, easily distracted and completing multiple tasks at once No symptoms of psychosis reportedly delusion and hallucination She denies suicidal thoughts and ideation Referral placed to psychiatry

## 2022-10-15 NOTE — Progress Notes (Signed)
Established Patient Office Visit  Subjective:  Patient ID: [name], female    DOB: 1980-06-17  Age: 42 y.o. MRN: [id number]  CC:  Chief Complaint  Patient presents with   Hypertension    2 week follow up for htn. Has been taking medication consistenly.    Leg Pain    Pt reports leg pain on right side.    Anxiety    Has had some chest pressure and having to take deep breaths sometimes to feel better. Started a new role in her job, causing her to stress, has seen behavioral health in the past for bipolar disorder, would like to further discuss.    HPI [name] is a 42 y.o. female with past medical history of primary hypertension, tobacco abuse, and rash in adult anxiety in go get some flashcards presents for blood pressure follow-up. For the details of today's visit, please refer to the assessment and plan.    History reviewed. No pertinent past medical history.  Past Surgical History:  Procedure Laterality Date   TUBAL LIGATION      Family History  Problem Relation Age of Onset   Breast cancer Maternal Aunt    Breast cancer Cousin     Social History   Socioeconomic History   Marital status: Single    Spouse name: Not on file   Number of children: Not on file   Years of education: Not on file   Highest education level: Not on file  Occupational History   Not on file  Tobacco Use   Smoking status: Some Days    Packs/day: .25    Types: Cigarettes   Smokeless tobacco: Never   Tobacco comments:    6 a day  Vaping Use   Vaping Use: Former  Substance and Sexual Activity   Alcohol use: Yes    Alcohol/week: 5.0 standard drinks of alcohol    Types: 5 Shots of liquor per week    Comment: daily   Drug use: Yes    Types: Marijuana    Comment: occ; last use a few days ago.   Sexual activity: Yes    Birth control/protection: Surgical  Other Topics Concern   Not on file  Social History Narrative   Not on file   Social Determinants of Health    Financial Resource Strain: Not on file  Food Insecurity: Not on file  Transportation Needs: Not on file  Physical Activity: Not on file  Stress: Not on file  Social Connections: Not on file  Intimate Partner Violence: Not on file    Outpatient Medications Prior to Visit  Medication Sig Dispense Refill   olmesartan (BENICAR) 20 MG tablet Take 1 tablet (20 mg total) by mouth daily. 30 tablet 1   Vitamin D, Ergocalciferol, (DRISDOL) 1.25 MG (50000 UNIT) CAPS capsule Take 1 capsule (50,000 Units total) by mouth every 7 (seven) days. 20 capsule 1   ELDERBERRY PO Take by mouth. (Patient not taking: Reported on 10/15/2022)     VITAMIN D PO Take by mouth. (Patient not taking: Reported on 09/30/2022)     Zinc Sulfate (ZINC 15 PO) Take by mouth. (Patient not taking: Reported on 09/30/2022)     No facility-administered medications prior to visit.    No Known Allergies  ROS Review of Systems  Constitutional:  Negative for chills and fever.  Eyes:  Negative for visual disturbance.  Respiratory:  Negative for chest tightness and shortness of breath.   Neurological:  Negative  for dizziness and headaches.      Objective:    Physical Exam HENT:     Head: Normocephalic.     Mouth/Throat:     Mouth: Mucous membranes are moist.  Cardiovascular:     Rate and Rhythm: Normal rate.     Heart sounds: Normal heart sounds.  Pulmonary:     Effort: Pulmonary effort is normal.     Breath sounds: Normal breath sounds.  Neurological:     Mental Status: She is alert.     BP 134/82   Pulse 81   Ht 5\' 6"  (1.676 m)   Wt 179 lb 1.9 oz (81.2 kg)   LMP 09/21/2022   SpO2 100%   BMI 28.91 kg/m  Wt Readings from Last 3 Encounters:  10/15/22 179 lb 1.9 oz (81.2 kg)  09/30/22 182 lb (82.6 kg)  09/08/22 178 lb 1.9 oz (80.8 kg)    Lab Results  Component Value Date   TSH 1.700 09/08/2022   Lab Results  Component Value Date   WBC 9.4 09/08/2022   HGB 15.2 09/08/2022   HCT 45.0 09/08/2022    MCV 98 (H) 09/08/2022   PLT 266 09/08/2022   Lab Results  Component Value Date   NA 139 09/08/2022   K 4.2 09/08/2022   CO2 23 09/08/2022   GLUCOSE 85 09/08/2022   BUN 6 09/08/2022   CREATININE 0.88 09/08/2022   BILITOT 0.9 09/08/2022   ALKPHOS 65 09/08/2022   AST 20 09/08/2022   ALT 17 09/08/2022   PROT 7.2 09/08/2022   ALBUMIN 4.3 09/08/2022   CALCIUM 9.8 09/08/2022   ANIONGAP 6 05/21/2021   EGFR 83 09/08/2022   Lab Results  Component Value Date   CHOL 185 09/08/2022   Lab Results  Component Value Date   HDL 81 09/08/2022   Lab Results  Component Value Date   LDLCALC 87 09/08/2022   Lab Results  Component Value Date   TRIG 93 09/08/2022   Lab Results  Component Value Date   CHOLHDL 2.3 09/08/2022   Lab Results  Component Value Date   HGBA1C 4.9 09/08/2022      Assessment & Plan:  Panic attack as reaction to stress Assessment & Plan: 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 Max:4mg /day  Dispense: 10 tablet; Refill: 0  Bipolar 1 disorder (HCC) Assessment & Plan: The patient reports history of bipolar and was diagnosed 20 years ago The medication taken is Abilify She has not been on therapy since She reports feeling full of energy with less and 2 hours of sleep, easily distracted and completing multiple tasks at once No  symptoms of psychosis reportedly delusion and hallucination She denies suicidal thoughts and ideation Referral placed to psychiatry    Orders: -     Ambulatory referral to Psychiatry  Primary hypertension Assessment & Plan: Controlled Asymptomatic  today in the clinic She reports compliance with olmesartan 20 mg daily Low-sodium diet with increased physical activity encouraged BP Readings from Last 3 Encounters:  10/15/22 134/82  09/30/22 (!) 140/90  09/08/22 126/78     Orders: -     BMP8+EGFR    Follow-up: Return in about 1 week (around 10/22/2022).   Gilmore Laroche, FNP

## 2022-10-15 NOTE — Patient Instructions (Addendum)
I appreciate the opportunity to provide care to you today!    Follow up:  1 week to discuss leg pain  Labs: BMP   Managing Panic attacks  Lowering stress  Talk with your health care provider or a counselor to learn more about lowering anxiety and stress. They may suggest tension-reduction techniques, such as: Music. Spend time creating or listening to music that you enjoy and that inspires you. Mindfulness-based meditation. Practice being aware of your normal breaths while not trying to control your breathing. It can be done while sitting or walking. Centering prayer. Focus on a word, phrase, or sacred image that means something to you and brings you peace. Deep breathing. Expand your stomach and inhale slowly through your nose. Hold your breath for 3-5 seconds. Then breathe out slowly, letting your stomach muscles relax. Self-talk. Learn to notice and spot thought patterns that lead to anxiety reactions. Change those patterns to thoughts that feel peaceful. Muscle relaxation. Take time to tense muscles and then relax them. Choose a tension-reduction technique that fits your lifestyle and personality. These techniques take time and practice. Set aside 5-15 minutes a day to do them. Specialized therapists can offer counseling and training in these techniques. The training to help with anxiety may be covered by some insurance plans. Other things you can do to manage stress and anxiety include: Keeping a stress diary. This can help you learn what triggers your reaction and then learn ways to manage your response. Thinking about how you react to certain situations. You may not be able to control everything, but you can control your response. Making time for activities that help you relax and not feeling guilty about spending your time in this way. Doing visual imagery. This involves imagining or creating mental pictures to help you relax. Practicing yoga. Through yoga poses, you can lower tension  and relax.  Referrals today- psychiatry   Please continue to a heart-healthy diet and increase your physical activities. Try to exercise for at least five days a week.      It was a pleasure to see you and I look forward to continuing to work together on your health and well-being. Please do not hesitate to call the office if you need care or have questions about your care.   Have a wonderful day and week. With Gratitude, Gilmore Laroche MSN, FNP-BC

## 2022-10-16 LAB — BMP8+EGFR
BUN/Creatinine Ratio: 13 (ref 9–23)
BUN: 8 mg/dL (ref 6–24)
CO2: 22 mmol/L (ref 20–29)
Calcium: 9.5 mg/dL (ref 8.7–10.2)
Chloride: 101 mmol/L (ref 96–106)
Creatinine, Ser: 0.64 mg/dL (ref 0.57–1.00)
Glucose: 84 mg/dL (ref 70–99)
Potassium: 4 mmol/L (ref 3.5–5.2)
Sodium: 139 mmol/L (ref 134–144)
eGFR: 112 mL/min/{1.73_m2} (ref >59)

## 2022-10-16 NOTE — Progress Notes (Signed)
Please inform the patient that her potassium and kidneys are stable

## 2022-10-20 ENCOUNTER — Ambulatory Visit (HOSPITAL_COMMUNITY)
Admission: RE | Admit: 2022-10-20 | Discharge: 2022-10-20 | Disposition: A | Discharge status: Home / Self Care | Payer: BC Managed Care – PPO | Source: Ambulatory Visit | Attending: Family Medicine | Admitting: Family Medicine

## 2022-10-20 DIAGNOSIS — R928 Other abnormal and inconclusive findings on diagnostic imaging of breast: Secondary | ICD-10-CM | POA: Insufficient documentation

## 2022-10-20 DIAGNOSIS — R92321 Mammographic fibroglandular density, right breast: Secondary | ICD-10-CM | POA: Diagnosis not present

## 2022-10-20 DIAGNOSIS — N6001 Solitary cyst of right breast: Secondary | ICD-10-CM | POA: Diagnosis not present

## 2022-10-22 DIAGNOSIS — M546 Pain in thoracic spine: Secondary | ICD-10-CM | POA: Diagnosis not present

## 2022-10-22 DIAGNOSIS — M9902 Segmental and somatic dysfunction of thoracic region: Secondary | ICD-10-CM | POA: Diagnosis not present

## 2022-10-22 DIAGNOSIS — M9903 Segmental and somatic dysfunction of lumbar region: Secondary | ICD-10-CM | POA: Diagnosis not present

## 2022-10-22 DIAGNOSIS — M62830 Muscle spasm of back: Secondary | ICD-10-CM | POA: Diagnosis not present

## 2022-10-27 ENCOUNTER — Ambulatory Visit: Payer: BC Managed Care – PPO | Admitting: Family Medicine

## 2022-11-02 ENCOUNTER — Encounter: Payer: Self-pay | Admitting: Family Medicine

## 2022-11-02 ENCOUNTER — Ambulatory Visit (INDEPENDENT_AMBULATORY_CARE_PROVIDER_SITE_OTHER): Payer: BC Managed Care – PPO | Admitting: Family Medicine

## 2022-11-02 VITALS — BP 128/93 | HR 79 | Ht 66.0 in | Wt 183.0 lb

## 2022-11-02 DIAGNOSIS — G629 Polyneuropathy, unspecified: Secondary | ICD-10-CM

## 2022-11-02 MED ORDER — GABAPENTIN 100 MG PO CAPS
100.0000 mg | ORAL_CAPSULE | Freq: Every day | ORAL | 1 refills | Status: DC
Start: 2022-11-02 — End: 2023-04-08

## 2022-11-02 MED ORDER — CYCLOBENZAPRINE HCL 5 MG PO TABS
5.0000 mg | ORAL_TABLET | Freq: Three times a day (TID) | ORAL | 1 refills | Status: DC | PRN
Start: 2022-11-02 — End: 2023-03-15

## 2022-11-02 NOTE — Progress Notes (Signed)
Established Patient Office Visit  Subjective:  Patient ID: [name], female    DOB: [birth year]  Age: 42 y.o. MRN: [id number]  CC:  Chief Complaint  Patient presents with   Leg Pain    Follow up leg pain    HPI [name] is a 42 y.o. female with past medical history of scoliosis presents with complaints of right leg pain. For the details of today's visit, please refer to the assessment and plan.     History reviewed. No pertinent past medical history.  Past Surgical History:  Procedure Laterality Date   TUBAL LIGATION      Family History  Problem Relation Age of Onset   Breast cancer Maternal Aunt    Breast cancer Cousin     Social History   Socioeconomic History   Marital status: Single    Spouse name: Not on file   Number of children: Not on file   Years of education: Not on file   Highest education level: Not on file  Occupational History   Not on file  Tobacco Use   Smoking status: Some Days    Packs/day: .25    Types: Cigarettes   Smokeless tobacco: Never   Tobacco comments:    6 a day  Vaping Use   Vaping Use: Former  Substance and Sexual Activity   Alcohol use: Yes    Alcohol/week: 5.0 standard drinks of alcohol    Types: 5 Shots of liquor per week    Comment: daily   Drug use: Yes    Types: Marijuana    Comment: occ; last use a few days ago.   Sexual activity: Yes    Birth control/protection: Surgical  Other Topics Concern   Not on file  Social History Narrative   Not on file   Social Determinants of Health   Financial Resource Strain: Not on file  Food Insecurity: Not on file  Transportation Needs: Not on file  Physical Activity: Not on file  Stress: Not on file  Social Connections: Not on file  Intimate Partner Violence: Not on file    Outpatient Medications Prior to Visit  Medication Sig Dispense Refill   clonazePAM (KLONOPIN) 0.5 MG tablet Take 1 tablet (0.5 mg total) by mouth as needed for anxiety. Max:4mg /day 10  tablet 0   olmesartan (BENICAR) 20 MG tablet Take 1 tablet (20 mg total) by mouth daily. 30 tablet 1   Vitamin D, Ergocalciferol, (DRISDOL) 1.25 MG (50000 UNIT) CAPS capsule Take 1 capsule (50,000 Units total) by mouth every 7 (seven) days. 20 capsule 1   ELDERBERRY PO Take by mouth. (Patient not taking: Reported on 10/15/2022)     VITAMIN D PO Take by mouth. (Patient not taking: Reported on 09/30/2022)     Zinc Sulfate (ZINC 15 PO) Take by mouth. (Patient not taking: Reported on 09/30/2022)     No facility-administered medications prior to visit.    No Known Allergies  ROS Review of Systems  Constitutional:  Negative for chills and fever.  Eyes:  Negative for visual disturbance.  Respiratory:  Negative for chest tightness and shortness of breath.   Musculoskeletal:  Positive for myalgias.  Neurological:  Negative for dizziness and headaches.      Objective:    Physical Exam HENT:     Head: Normocephalic.     Mouth/Throat:     Mouth: Mucous membranes are moist.  Cardiovascular:     Rate and Rhythm: Normal rate.  Heart sounds: Normal heart sounds.  Pulmonary:     Effort: Pulmonary effort is normal.     Breath sounds: Normal breath sounds.  Musculoskeletal:     Right lower leg: No swelling, deformity or tenderness. No edema.  Neurological:     Mental Status: She is alert.     BP (!) 128/93 (BP Location: Right Arm, Patient Position: Sitting, Cuff Size: Large)   Pulse 79   Ht 5\' 6"  (1.676 m)   Wt 183 lb (83 kg)   LMP 09/21/2022   SpO2 96%   BMI 29.54 kg/m  Wt Readings from Last 3 Encounters:  11/02/22 183 lb (83 kg)  10/15/22 179 lb 1.9 oz (81.2 kg)  09/30/22 182 lb (82.6 kg)    Lab Results  Component Value Date   TSH 1.700 09/08/2022   Lab Results  Component Value Date   WBC 9.4 09/08/2022   HGB 15.2 09/08/2022   HCT 45.0 09/08/2022   MCV 98 (H) 09/08/2022   PLT 266 09/08/2022   Lab Results  Component Value Date   NA 139 10/15/2022   K 4.0 10/15/2022    CO2 22 10/15/2022   GLUCOSE 84 10/15/2022   BUN 8 10/15/2022   CREATININE 0.64 10/15/2022   BILITOT 0.9 09/08/2022   ALKPHOS 65 09/08/2022   AST 20 09/08/2022   ALT 17 09/08/2022   PROT 7.2 09/08/2022   ALBUMIN 4.3 09/08/2022   CALCIUM 9.5 10/15/2022   ANIONGAP 6 05/21/2021   EGFR 112 10/15/2022   Lab Results  Component Value Date   CHOL 185 09/08/2022   Lab Results  Component Value Date   HDL 81 09/08/2022   Lab Results  Component Value Date   LDLCALC 87 09/08/2022   Lab Results  Component Value Date   TRIG 93 09/08/2022   Lab Results  Component Value Date   CHOLHDL 2.3 09/08/2022   Lab Results  Component Value Date   HGBA1C 4.9 09/08/2022      Assessment & Plan:  Neuropathy Assessment & Plan: She complains of pain lateral hip that radiates to the Quadriceps Pain is described achy and is rated 8 out of 10 Pain is relieved with movement and is aggravated with prolonged sitting She reports seeing a chiropractor for about a week and will be attending her visits once every 2 weeks Complains of numbness and tingling in affected leg No recent injury or trauma reported Will treat today with Flexeril 5 mg to take 3 times daily as needed and gabapentin 100 mg nightly Encourage stretching exercises, rest, and avoidance of aggravating activities Encouraged heat and cold therapy at the affected site The patient declines physical therapy referral at this time as she is seeing a chiropractor Will follow-up in 4 weeks   Orders: -     Cyclobenzaprine HCl; Take 1 tablet (5 mg total) by mouth 3 (three) times daily as needed for muscle spasms.  Dispense: 30 tablet; Refill: 1 -     Gabapentin; Take 1 capsule (100 mg total) by mouth at bedtime.  Dispense: 30 capsule; Refill: 1  Note: This chart has been completed using Engineer, civil (consulting) software, and while attempts have been made to ensure accuracy, certain words and phrases may not be transcribed as intended.     Follow-up: Return in about 1 month (around 12/02/2022).   Nancy Laroche, FNP

## 2022-11-02 NOTE — Patient Instructions (Addendum)
I appreciate the opportunity to provide care to you today!    Follow up:  1 months  Neuropathy Please start taking gabapentin 100 mg nightly I also recommend taking over-the-counter vitamin B12 1000 mcg daily Flexeril 5 mg to take 3 times daily is sent to your pharmacy, I recommend taking at bedtime to decrease the side effects of drowsiness   Please continue to a heart-healthy diet and increase your physical activities. Try to exercise for at least five days a week.      It was a pleasure to see you and I look forward to continuing to work together on your health and well-being. Please do not hesitate to call the office if you need care or have questions about your care.   Have a wonderful day and week. With Gratitude, Gilmore Laroche MSN, FNP-BC

## 2022-11-02 NOTE — Assessment & Plan Note (Signed)
She complains of pain lateral hip that radiates to the Quadriceps Pain is described achy and is rated 8 out of 10 Pain is relieved with movement and is aggravated with prolonged sitting She reports seeing a chiropractor for about a week and will be attending her visits once every 2 weeks Complains of numbness and tingling in affected leg No recent injury or trauma reported Will treat today with Flexeril 5 mg to take 3 times daily as needed and gabapentin 100 mg nightly Encourage stretching exercises, rest, and avoidance of aggravating activities Encouraged heat and cold therapy at the affected site The patient declines physical therapy referral at this time as she is seeing a chiropractor Will follow-up in 4 weeks

## 2022-11-04 DIAGNOSIS — M546 Pain in thoracic spine: Secondary | ICD-10-CM | POA: Diagnosis not present

## 2022-11-04 DIAGNOSIS — M62830 Muscle spasm of back: Secondary | ICD-10-CM | POA: Diagnosis not present

## 2022-11-04 DIAGNOSIS — M9902 Segmental and somatic dysfunction of thoracic region: Secondary | ICD-10-CM | POA: Diagnosis not present

## 2022-11-04 DIAGNOSIS — M9903 Segmental and somatic dysfunction of lumbar region: Secondary | ICD-10-CM | POA: Diagnosis not present

## 2022-11-21 ENCOUNTER — Other Ambulatory Visit: Payer: Self-pay | Admitting: Family Medicine

## 2022-11-21 DIAGNOSIS — F319 Bipolar disorder, unspecified: Secondary | ICD-10-CM

## 2022-11-24 ENCOUNTER — Other Ambulatory Visit: Payer: Self-pay | Admitting: Family Medicine

## 2022-11-24 DIAGNOSIS — I10 Essential (primary) hypertension: Secondary | ICD-10-CM

## 2022-11-27 ENCOUNTER — Other Ambulatory Visit: Payer: Self-pay | Admitting: Family Medicine

## 2022-11-27 DIAGNOSIS — F410 Panic disorder [episodic paroxysmal anxiety] without agoraphobia: Secondary | ICD-10-CM

## 2022-11-27 DIAGNOSIS — I10 Essential (primary) hypertension: Secondary | ICD-10-CM

## 2022-11-27 MED ORDER — OLMESARTAN MEDOXOMIL 20 MG PO TABS
20.0000 mg | ORAL_TABLET | Freq: Every day | ORAL | 0 refills | Status: DC
Start: 2022-11-27 — End: 2023-01-18

## 2022-11-27 MED ORDER — CLONAZEPAM 0.5 MG PO TABS
0.5000 mg | ORAL_TABLET | ORAL | 0 refills | Status: DC | PRN
Start: 2022-11-27 — End: 2023-04-08

## 2022-12-03 ENCOUNTER — Encounter: Payer: Self-pay | Admitting: Family Medicine

## 2022-12-07 ENCOUNTER — Ambulatory Visit (INDEPENDENT_AMBULATORY_CARE_PROVIDER_SITE_OTHER): Payer: BC Managed Care – PPO | Admitting: Family Medicine

## 2022-12-07 ENCOUNTER — Encounter: Payer: Self-pay | Admitting: Family Medicine

## 2022-12-07 VITALS — BP 110/75 | HR 92 | Ht 66.0 in | Wt 181.0 lb

## 2022-12-07 DIAGNOSIS — F410 Panic disorder [episodic paroxysmal anxiety] without agoraphobia: Secondary | ICD-10-CM

## 2022-12-07 DIAGNOSIS — F430 Acute stress reaction: Secondary | ICD-10-CM | POA: Diagnosis not present

## 2022-12-07 DIAGNOSIS — L819 Disorder of pigmentation, unspecified: Secondary | ICD-10-CM | POA: Diagnosis not present

## 2022-12-07 DIAGNOSIS — F319 Bipolar disorder, unspecified: Secondary | ICD-10-CM

## 2022-12-07 MED ORDER — TRETINOIN 0.025 % EX CREA
TOPICAL_CREAM | Freq: Every day | CUTANEOUS | 0 refills | Status: AC
Start: 2022-12-07 — End: ?

## 2022-12-07 NOTE — Patient Instructions (Addendum)
I appreciate the opportunity to provide care to you today!    Follow up:  1 month   Retin-A (apply in the evening) Retin-A helps decrease facial wrinkles and hyper or hypopigmentation Apply to acne lesions once daily before bedtime or in the evening; apply 20-30 minutes after cleansing the face;Patients should apply a thin layer of the topical retinoid to the entire affected area rather than as a spot treatment to individual lesions. A pea-sized amount of medication is usually sufficient to cover the entire face. Skin should be dry at the time of application    Nonpharmacologic management of anxiety and depression  Mindfulness and Meditation Practices like mindfulness meditation can help reduce symptoms by promoting relaxation and present-moment awareness.  Exercise  Regular physical activity has been shown to improve mood and reduce anxiety through the release of endorphins and other neurochemicals.  Healthy Diet Eating a balanced diet rich in fruits, vegetables, whole grains, and lean proteins can support overall mental health.  Sleep Hygiene  Establishing a regular sleep routine and ensuring good sleep quality can significantly impact mood and anxiety levels.  Stress Management Techniques Activities such as yoga, tai chi, and deep breathing exercises can help manage stress.  Social Support Maintaining strong relationships and seeking support from friends, family, or support groups can provide emotional comfort and reduce feelings of isolation.  Lifestyle Modifications Reducing alcohol and caffeine intake, quitting smoking, and avoiding recreational drugs can improve symptoms.  Art and Music Therapy Engaging in creative activities like painting, drawing, or playing music can be therapeutic and help express emotions.  Light Therapy Particularly useful for seasonal affective disorder (SAD), exposure to bright light can help regulate mood. Marland Kitchen   Referral: psychiatry   Attached with your  AVS, you will find valuable resources for self-education. I highly recommend dedicating some time to thoroughly examine them.   Please continue to a heart-healthy diet and increase your physical activities. Try to exercise for at least five days a week.    It was a pleasure to see you and I look forward to continuing to work together on your health and well-being. Please do not hesitate to call the office if you need care or have questions about your care.  In case of emergency, please visit the Emergency Department for urgent care, or contact our clinic at [phone] to schedule an appointment. We're here to help you!   Have a wonderful day and week. With Gratitude, Gilmore Laroche MSN, FNP-BC

## 2022-12-07 NOTE — Progress Notes (Signed)
Established Patient Office Visit  Subjective:  Patient ID: [name], female    DOB: 1980-11-20  Age: 42 y.o. MRN: [id number]  CC:  Chief Complaint  Patient presents with   Care Management    1 month f/u, would like to discuss dark spots on her face. Has questions about Nitric oxide for bp. Has bp concerns. Referral for behavioral health has not been scheduled, pt has not heard from [facility] and does not want to go to [facility].     HPI [name] is a 42 y.o. female with past medical history of Bipolar 1 disorder presents for f/u. For the details of today's visit, please refer to the assessment and plan.     History reviewed. No pertinent past medical history.  Past Surgical History:  Procedure Laterality Date   TUBAL LIGATION      Family History  Problem Relation Age of Onset   Breast cancer Maternal Aunt    Breast cancer Cousin     Social History   Socioeconomic History   Marital status: Single    Spouse name: Not on file   Number of children: Not on file   Years of education: Not on file   Highest education level: Not on file  Occupational History   Not on file  Tobacco Use   Smoking status: Some Days    Current packs/day: 0.25    Types: Cigarettes   Smokeless tobacco: Never   Tobacco comments:    6 a day  Vaping Use   Vaping status: Former  Substance and Sexual Activity   Alcohol use: Yes    Alcohol/week: 5.0 standard drinks of alcohol    Types: 5 Shots of liquor per week    Comment: daily   Drug use: Yes    Types: Marijuana    Comment: occ; last use a few days ago.   Sexual activity: Yes    Birth control/protection: Surgical  Other Topics Concern   Not on file  Social History Narrative   Not on file   Social Determinants of Health   Financial Resource Strain: Not on file  Food Insecurity: Not on file  Transportation Needs: Not on file  Physical Activity: Not on file  Stress: Not on file  Social Connections: Not on file  Intimate  Partner Violence: Not on file    Outpatient Medications Prior to Visit  Medication Sig Dispense Refill   clonazePAM (KLONOPIN) 0.5 MG tablet Take 1 tablet (0.5 mg total) by mouth as needed for anxiety. Max:4mg /day 10 tablet 0   cyclobenzaprine (FLEXERIL) 5 MG tablet Take 1 tablet (5 mg total) by mouth 3 (three) times daily as needed for muscle spasms. 30 tablet 1   ELDERBERRY PO Take by mouth.     gabapentin (NEURONTIN) 100 MG capsule Take 1 capsule (100 mg total) by mouth at bedtime. 30 capsule 1   olmesartan (BENICAR) 20 MG tablet Take 1 tablet (20 mg total) by mouth daily. 30 tablet 0   VITAMIN D PO Take by mouth.     Vitamin D, Ergocalciferol, (DRISDOL) 1.25 MG (50000 UNIT) CAPS capsule Take 1 capsule (50,000 Units total) by mouth every 7 (seven) days. 20 capsule 1   Zinc Sulfate (ZINC 15 PO) Take by mouth.     No facility-administered medications prior to visit.    No Known Allergies  ROS Review of Systems  Constitutional:  Negative for chills and fever.  Eyes:  Negative for visual disturbance.  Respiratory:  Negative for chest tightness and shortness of breath.   Neurological:  Negative for dizziness and headaches.      Objective:    Physical Exam HENT:     Head: Normocephalic.     Mouth/Throat:     Mouth: Mucous membranes are moist.  Cardiovascular:     Rate and Rhythm: Normal rate.     Heart sounds: Normal heart sounds.  Pulmonary:     Effort: Pulmonary effort is normal.     Breath sounds: Normal breath sounds.  Neurological:     Mental Status: She is alert.     BP 110/75   Pulse 92   Ht 5\' 6"  (1.676 m)   Wt 181 lb 0.6 oz (82.1 kg)   SpO2 96%   BMI 29.22 kg/m  Wt Readings from Last 3 Encounters:  12/07/22 181 lb 0.6 oz (82.1 kg)  11/02/22 183 lb (83 kg)  10/15/22 179 lb 1.9 oz (81.2 kg)    Lab Results  Component Value Date   TSH 1.700 09/08/2022   Lab Results  Component Value Date   WBC 9.4 09/08/2022   HGB 15.2 09/08/2022   HCT 45.0  09/08/2022   MCV 98 (H) 09/08/2022   PLT 266 09/08/2022   Lab Results  Component Value Date   NA 139 10/15/2022   K 4.0 10/15/2022   CO2 22 10/15/2022   GLUCOSE 84 10/15/2022   BUN 8 10/15/2022   CREATININE 0.64 10/15/2022   BILITOT 0.9 09/08/2022   ALKPHOS 65 09/08/2022   AST 20 09/08/2022   ALT 17 09/08/2022   PROT 7.2 09/08/2022   ALBUMIN 4.3 09/08/2022   CALCIUM 9.5 10/15/2022   ANIONGAP 6 05/21/2021   EGFR 112 10/15/2022   Lab Results  Component Value Date   CHOL 185 09/08/2022   Lab Results  Component Value Date   HDL 81 09/08/2022   Lab Results  Component Value Date   LDLCALC 87 09/08/2022   Lab Results  Component Value Date   TRIG 93 09/08/2022   Lab Results  Component Value Date   CHOLHDL 2.3 09/08/2022   Lab Results  Component Value Date   HGBA1C 4.9 09/08/2022      Assessment & Plan:  Bipolar 1 disorder (HCC) Assessment & Plan: The will like another referral She does not want to go [facility] Referral palpced  Orders: -     Ambulatory referral to Psychiatry  Panic attack as reaction to stress -     Ambulatory referral to Psychiatry  Hyperpigmentation of skin Assessment & Plan: C/o dark spots on her face Will treat today with Retin-A Apply to acne lesions once daily before bedtime or in the evening; apply 20-30 minutes after cleansing the face;Patients should apply a thin layer of the topical retinoid to the entire affected area rather than as a spot treatment to individual lesions. A pea-sized amount of medication is usually sufficient to cover the entire face. Skin should be dry at the time of application    Orders: -     Tretinoin; Apply topically at bedtime.  Dispense: 45 g; Refill: 0  Note: This chart has been completed using Engineer, civil (consulting) software, and while attempts have been made to ensure accuracy, certain words and phrases may not be transcribed as intended.    Follow-up: Return in about 1 month (around 01/07/2023).    Nancy Laroche, FNP

## 2022-12-08 ENCOUNTER — Encounter: Payer: Self-pay | Admitting: Family Medicine

## 2022-12-11 DIAGNOSIS — L819 Disorder of pigmentation, unspecified: Secondary | ICD-10-CM | POA: Insufficient documentation

## 2022-12-11 NOTE — Assessment & Plan Note (Signed)
C/o dark spots on her face Will treat today with Retin-A Apply to acne lesions once daily before bedtime or in the evening; apply 20-30 minutes after cleansing the face;Patients should apply a thin layer of the topical retinoid to the entire affected area rather than as a spot treatment to individual lesions. A pea-sized amount of medication is usually sufficient to cover the entire face. Skin should be dry at the time of application

## 2022-12-11 NOTE — Assessment & Plan Note (Signed)
The will like another referral She does not want to go [facility] Referral palpced

## 2022-12-14 ENCOUNTER — Other Ambulatory Visit: Payer: Self-pay | Admitting: Family Medicine

## 2022-12-14 DIAGNOSIS — T7840XD Allergy, unspecified, subsequent encounter: Secondary | ICD-10-CM

## 2022-12-14 MED ORDER — HYDROCORTISONE 2.5 % EX CREA
TOPICAL_CREAM | Freq: Two times a day (BID) | CUTANEOUS | 0 refills | Status: DC
Start: 2022-12-14 — End: 2023-04-08

## 2023-01-07 ENCOUNTER — Encounter: Payer: Self-pay | Admitting: Family Medicine

## 2023-01-07 ENCOUNTER — Ambulatory Visit (INDEPENDENT_AMBULATORY_CARE_PROVIDER_SITE_OTHER): Payer: BC Managed Care – PPO | Admitting: Family Medicine

## 2023-01-07 VITALS — BP 130/92 | HR 85 | Ht 66.0 in | Wt 183.0 lb

## 2023-01-07 DIAGNOSIS — I10 Essential (primary) hypertension: Secondary | ICD-10-CM | POA: Diagnosis not present

## 2023-01-07 DIAGNOSIS — L819 Disorder of pigmentation, unspecified: Secondary | ICD-10-CM | POA: Diagnosis not present

## 2023-01-07 NOTE — Patient Instructions (Addendum)
I appreciate the opportunity to provide care to you today!    Follow up:  03/15/2023  Hypertension Management: Medication: Continue taking Olmesartan 20 mg daily. Monitoring: Hold your blood pressure medication if your blood pressure is less than 90/60 mmHg. Target: Aim to maintain your blood pressure below 140/90 mmHg. Measurement: Check your blood pressure at least one hour after taking your medication. Lifestyle Recommendations: Adhere to a low-sodium diet and increase physical activity.   Attached with your AVS, you will find valuable resources for self-education. I highly recommend dedicating some time to thoroughly examine them.   Please continue to a heart-healthy diet and increase your physical activities. Try to exercise for at least five days a week.    It was a pleasure to see you and I look forward to continuing to work together on your health and well-being. Please do not hesitate to call the office if you need care or have questions about your care.  In case of emergency, please visit the Emergency Department for urgent care, or contact our clinic at [phone] to schedule an appointment. We're here to help you!   Have a wonderful day and week. With Gratitude, Gilmore Laroche MSN, FNP-BC

## 2023-01-07 NOTE — Progress Notes (Signed)
Established Patient Office Visit  Subjective:  Patient ID: [name], female    DOB: 1981-01-20  Age: 42 y.o. MRN: [id number]  CC:  Chief Complaint  Patient presents with   Care Management    1 month f/u, would like to discuss bp dose.    HPI [name] is a 42 y.o. female presents for with 1 month follow-up for hyperpigmentation of the skin. For the details of today's visit, please refer to the assessment and plan.    History reviewed. No pertinent past medical history.  Past Surgical History:  Procedure Laterality Date   TUBAL LIGATION      Family History  Problem Relation Age of Onset   Breast cancer Maternal Aunt    Breast cancer Cousin     Social History   Socioeconomic History   Marital status: Single    Spouse name: Not on file   Number of children: Not on file   Years of education: Not on file   Highest education level: Not on file  Occupational History   Not on file  Tobacco Use   Smoking status: Some Days    Current packs/day: 0.25    Types: Cigarettes   Smokeless tobacco: Never   Tobacco comments:    6 a day  Vaping Use   Vaping status: Former  Substance and Sexual Activity   Alcohol use: Yes    Alcohol/week: 5.0 standard drinks of alcohol    Types: 5 Shots of liquor per week    Comment: daily   Drug use: Yes    Types: Marijuana    Comment: occ; last use a few days ago.   Sexual activity: Yes    Birth control/protection: Surgical  Other Topics Concern   Not on file  Social History Narrative   Not on file   Social Determinants of Health   Financial Resource Strain: Not on file  Food Insecurity: Not on file  Transportation Needs: Not on file  Physical Activity: Not on file  Stress: Not on file  Social Connections: Not on file  Intimate Partner Violence: Not on file    Outpatient Medications Prior to Visit  Medication Sig Dispense Refill   clonazePAM (KLONOPIN) 0.5 MG tablet Take 1 tablet (0.5 mg total) by mouth as needed  for anxiety. Max:4mg /day 10 tablet 0   cyclobenzaprine (FLEXERIL) 5 MG tablet Take 1 tablet (5 mg total) by mouth 3 (three) times daily as needed for muscle spasms. 30 tablet 1   ELDERBERRY PO Take by mouth.     gabapentin (NEURONTIN) 100 MG capsule Take 1 capsule (100 mg total) by mouth at bedtime. 30 capsule 1   hydrocortisone 2.5 % cream Apply topically 2 (two) times daily. 30 g 0   olmesartan (BENICAR) 20 MG tablet Take 1 tablet (20 mg total) by mouth daily. 30 tablet 0   tretinoin (RETIN-A) 0.025 % cream Apply topically at bedtime. 45 g 0   VITAMIN D PO Take by mouth.     Vitamin D, Ergocalciferol, (DRISDOL) 1.25 MG (50000 UNIT) CAPS capsule Take 1 capsule (50,000 Units total) by mouth every 7 (seven) days. 20 capsule 1   Zinc Sulfate (ZINC 15 PO) Take by mouth.     No facility-administered medications prior to visit.    No Known Allergies  ROS Review of Systems  Constitutional:  Negative for chills and fever.  Eyes:  Negative for visual disturbance.  Respiratory:  Negative for chest tightness and shortness of breath.  Neurological:  Negative for dizziness and headaches.      Objective:    Physical Exam HENT:     Head: Normocephalic.     Mouth/Throat:     Mouth: Mucous membranes are moist.  Cardiovascular:     Rate and Rhythm: Normal rate.     Heart sounds: Normal heart sounds.  Pulmonary:     Effort: Pulmonary effort is normal.     Breath sounds: Normal breath sounds.  Neurological:     Mental Status: She is alert.     BP (!) 130/92   Pulse 85   Ht 5\' 6"  (1.676 m)   Wt 183 lb 0.6 oz (83 kg)   SpO2 90%   BMI 29.54 kg/m  Wt Readings from Last 3 Encounters:  01/07/23 183 lb 0.6 oz (83 kg)  12/07/22 181 lb 0.6 oz (82.1 kg)  11/02/22 183 lb (83 kg)    Lab Results  Component Value Date   TSH 1.700 09/08/2022   Lab Results  Component Value Date   WBC 9.4 09/08/2022   HGB 15.2 09/08/2022   HCT 45.0 09/08/2022   MCV 98 (H) 09/08/2022   PLT 266  09/08/2022   Lab Results  Component Value Date   NA 139 10/15/2022   K 4.0 10/15/2022   CO2 22 10/15/2022   GLUCOSE 84 10/15/2022   BUN 8 10/15/2022   CREATININE 0.64 10/15/2022   BILITOT 0.9 09/08/2022   ALKPHOS 65 09/08/2022   AST 20 09/08/2022   ALT 17 09/08/2022   PROT 7.2 09/08/2022   ALBUMIN 4.3 09/08/2022   CALCIUM 9.5 10/15/2022   ANIONGAP 6 05/21/2021   EGFR 112 10/15/2022   Lab Results  Component Value Date   CHOL 185 09/08/2022   Lab Results  Component Value Date   HDL 81 09/08/2022   Lab Results  Component Value Date   LDLCALC 87 09/08/2022   Lab Results  Component Value Date   TRIG 93 09/08/2022   Lab Results  Component Value Date   CHOLHDL 2.3 09/08/2022   Lab Results  Component Value Date   HGBA1C 4.9 09/08/2022      Assessment & Plan:  Primary hypertension Assessment & Plan: The patient's blood pressure was noted to be uncontrolled during the clinic visit, as she reported taking her blood pressure medication prior to the appointment. She is asymptomatic in the clinic today and states that her blood pressure is generally well-managed with her current treatment regimen.  I encourage the patient to continue taking Olmesartan 20 mg daily. To ensure proper monitoring and management: Hold Medication: Refrain from taking your blood pressure medication if your blood pressure is less than 90/60 mmHg. Target Range: Aim to maintain your blood pressure below 140/90 mmHg. Measurement: Check your blood pressure at least one hour after taking your medication. Additionally, I recommend adhering to a low-sodium diet and increasing physical activity to support overall blood pressure control.   Hyperpigmentation of skin Assessment & Plan: Currently doing well with topical Retin-A cream Encouraged to continue treatment regimen   Note: This chart has been completed using Engineer, civil (consulting) software, and while attempts have been made to ensure accuracy,  certain words and phrases may not be transcribed as intended.    Follow-up: Return in about 2 months (around 03/15/2023).   Gilmore Laroche, FNP

## 2023-01-08 NOTE — Assessment & Plan Note (Signed)
The patient's blood pressure was noted to be uncontrolled during the clinic visit, as she reported taking her blood pressure medication prior to the appointment. She is asymptomatic in the clinic today and states that her blood pressure is generally well-managed with her current treatment regimen.  I encourage the patient to continue taking Olmesartan 20 mg daily. To ensure proper monitoring and management: Hold Medication: Refrain from taking your blood pressure medication if your blood pressure is less than 90/60 mmHg. Target Range: Aim to maintain your blood pressure below 140/90 mmHg. Measurement: Check your blood pressure at least one hour after taking your medication. Additionally, I recommend adhering to a low-sodium diet and increasing physical activity to support overall blood pressure control.

## 2023-01-08 NOTE — Assessment & Plan Note (Addendum)
Currently doing well with topical Retin-A cream Encouraged to continue treatment regimen

## 2023-01-13 ENCOUNTER — Encounter: Payer: Self-pay | Admitting: Family Medicine

## 2023-01-14 ENCOUNTER — Emergency Department (HOSPITAL_COMMUNITY)
Admission: EM | Admit: 2023-01-14 | Discharge: 2023-01-14 | Disposition: A | Discharge status: Home / Self Care | Payer: BC Managed Care – PPO | Attending: Emergency Medicine | Admitting: Emergency Medicine

## 2023-01-14 ENCOUNTER — Emergency Department (HOSPITAL_COMMUNITY): Payer: BC Managed Care – PPO

## 2023-01-14 ENCOUNTER — Encounter (HOSPITAL_COMMUNITY): Payer: Self-pay | Admitting: Emergency Medicine

## 2023-01-14 ENCOUNTER — Other Ambulatory Visit: Payer: Self-pay

## 2023-01-14 DIAGNOSIS — R0602 Shortness of breath: Secondary | ICD-10-CM | POA: Insufficient documentation

## 2023-01-14 DIAGNOSIS — R079 Chest pain, unspecified: Secondary | ICD-10-CM | POA: Diagnosis not present

## 2023-01-14 DIAGNOSIS — Z79899 Other long term (current) drug therapy: Secondary | ICD-10-CM | POA: Insufficient documentation

## 2023-01-14 DIAGNOSIS — R0789 Other chest pain: Secondary | ICD-10-CM | POA: Insufficient documentation

## 2023-01-14 DIAGNOSIS — I10 Essential (primary) hypertension: Secondary | ICD-10-CM | POA: Diagnosis not present

## 2023-01-14 HISTORY — DX: Essential (primary) hypertension: I10

## 2023-01-14 LAB — CBC
HCT: 39.3 % (ref 36.0–46.0)
Hemoglobin: 13.6 g/dL (ref 12.0–15.0)
MCH: 33.8 pg (ref 26.0–34.0)
MCHC: 34.6 g/dL (ref 30.0–36.0)
MCV: 97.8 fL (ref 80.0–100.0)
Platelets: 288 10*3/uL (ref 150–400)
RBC: 4.02 MIL/uL (ref 3.87–5.11)
RDW: 12.8 % (ref 11.5–15.5)
WBC: 9.2 10*3/uL (ref 4.0–10.5)
nRBC: 0 % (ref 0.0–0.2)

## 2023-01-14 LAB — BASIC METABOLIC PANEL
Anion gap: 10 (ref 5–15)
BUN: 6 mg/dL (ref 6–20)
CO2: 27 mmol/L (ref 22–32)
Calcium: 8.7 mg/dL — ABNORMAL LOW (ref 8.9–10.3)
Chloride: 99 mmol/L (ref 98–111)
Creatinine, Ser: 0.57 mg/dL (ref 0.44–1.00)
GFR, Estimated: >60 mL/min (ref >60)
Glucose, Bld: 92 mg/dL (ref 70–99)
Potassium: 3 mmol/L — ABNORMAL LOW (ref 3.5–5.1)
Sodium: 136 mmol/L (ref 135–145)

## 2023-01-14 LAB — TROPONIN I (HIGH SENSITIVITY)
Troponin I (High Sensitivity): 2 ng/L (ref <18)
Troponin I (High Sensitivity): 3 ng/L (ref <18)

## 2023-01-14 LAB — POC URINE PREG, ED: Preg Test, Ur: NEGATIVE — NL

## 2023-01-14 MED ORDER — HYDROXYZINE HCL 25 MG PO TABS: 25.0000 mg | ORAL_TABLET | Freq: Four times a day (QID) | ORAL | 0 refills | Status: DC

## 2023-01-14 MED ORDER — HYDROXYZINE HCL 25 MG PO TABS
25.0000 mg | ORAL_TABLET | Freq: Once | ORAL | Status: AC
  Administered 2023-01-14: 25 mg via ORAL
  Filled 2023-01-14: qty 1

## 2023-01-14 NOTE — ED Triage Notes (Signed)
Pt reports chest pain and hypertension. Pt stating she has left sided chest pain and SHOB that wakes her up from sleep.

## 2023-01-14 NOTE — ED Notes (Signed)
Patient transported to X-ray 

## 2023-01-14 NOTE — ED Notes (Signed)
Pt brought back from main lobby to room 19. Reports having left sided centralized chest pain that has been intermittent for several weeks now. CP associated with SOB and palpations. No radiation. CP not associated with exacerbation. Also reports having increase blood pressure managed by PCP with prescribed BP medication and new stressful job.

## 2023-01-14 NOTE — Discharge Instructions (Addendum)
As we discussed, I have a low suspicion that this is coming from your heart.  I do not think this is a heart attack or blood clot in your lungs which is great news.  I given you a prescription for Atarax which is the anxiety medication that we discussed.  This medication should not make you drowsy but I would try taking it at home a few times to see how you react before operating heavy machinery.  Please keep your appointment with your PCP.  You may return to the emergency department for any worsening symptoms.

## 2023-01-14 NOTE — ED Provider Notes (Signed)
[place] EMERGENCY DEPARTMENT AT [facility] Provider Note   CSN: [id number] Arrival date & time: 01/14/23  1832     History Chief Complaint  Patient presents with   Chest Pain    [name] is a 42 y.o. female patient with history of hypertension who presents to the emergency department today for further evaluation of chest pain has been ongoing for several weeks intermittently.  Patient states that she gets sudden sharp left-sided episodes of chest pain that last a few seconds and go away.  She is also complaining of more constant chest tightness.  She endorses shortness of breath as well when she has episodes of elevated blood pressure.  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  She denies fever, chills, leg pain, leg swelling, syncope.   Chest Pain      Home Medications Prior to Admission medications   Medication Sig Start Date End Date Taking? Authorizing Provider  hydrOXYzine (ATARAX) 25 MG tablet Take 1 tablet (25 mg total) by mouth every 6 (six) hours. 01/14/23  Yes Meredeth Ide, Atwell Mcdanel M, PA-C  clonazePAM (KLONOPIN) 0.5 MG tablet Take 1 tablet (0.5 mg total) by mouth as needed for anxiety. Max:4mg /day 11/27/22   Gilmore Laroche, FNP  cyclobenzaprine (FLEXERIL) 5 MG tablet Take 1 tablet (5 mg total) by mouth 3 (three) times daily as needed for muscle spasms. 11/02/22   Gilmore Laroche, FNP  ELDERBERRY PO Take by mouth.    [provider]  gabapentin (NEURONTIN) 100 MG capsule Take 1 capsule (100 mg total) by mouth at bedtime. 11/02/22   Gilmore Laroche, FNP  hydrocortisone 2.5 % cream Apply topically 2 (two) times daily. 12/14/22   Gilmore Laroche, FNP  olmesartan (BENICAR) 20 MG tablet Take 1 tablet (20 mg total) by mouth daily. 11/27/22   Gilmore Laroche, FNP  tretinoin (RETIN-A) 0.025 % cream Apply topically at bedtime. 12/07/22   Gilmore Laroche, FNP  VITAMIN D PO Take by  mouth.    [provider]  Vitamin D, Ergocalciferol, (DRISDOL) 1.25 MG (50000 UNIT) CAPS capsule Take 1 capsule (50,000 Units total) by mouth every 7 (seven) days. 09/30/22   Gilmore Laroche, FNP  Zinc Sulfate (ZINC 15 PO) Take by mouth.    [provider]      Allergies    Patient has no known allergies.    Review of Systems   Review of Systems  Cardiovascular:  Positive for chest pain.  All other systems reviewed and are negative.   Physical Exam Updated Vital Signs BP (!) 144/97   Pulse 67   Temp 98.4 F (36.9 C) (Oral)   Resp 15   SpO2 96%  Physical Exam Vitals and nursing note reviewed.  Constitutional:      General: She is not in acute distress.    Appearance: Normal appearance.  HENT:     Head: Normocephalic and atraumatic.  Eyes:     General:        Right eye: No discharge.        Left eye: No discharge.  Cardiovascular:     Comments: Regular rate and rhythm.  S1/S2 are distinct without any evidence of murmur, rubs, or gallops.  Radial pulses are 2+ bilaterally.  Dorsalis pedis pulses are 2+ bilaterally.  No evidence of pedal edema. Pulmonary:     Comments: Clear to auscultation bilaterally.  Normal effort.  No respiratory distress.  No evidence of wheezes, rales, or rhonchi heard throughout. Abdominal:     General: Abdomen is flat. Bowel sounds are normal. There is no distension.     Tenderness: There is no abdominal tenderness. There is no guarding or rebound.  Musculoskeletal:        General: Normal range of motion.     Cervical back: Neck supple.  Skin:    General: Skin is warm and dry.     Findings: No rash.  Neurological:     General: No focal deficit present.     Mental Status: She is alert.  Psychiatric:        Mood and Affect: Mood normal.        Behavior: Behavior normal.     ED Results / Procedures / Treatments   Labs (all labs ordered are listed, but only abnormal results are displayed) Labs Reviewed  BASIC METABOLIC  PANEL - Abnormal; Notable for the following components:      Result Value   Potassium 3.0 (*)    Calcium 8.7 (*)    All other components within normal limits  CBC  POC URINE PREG, ED  TROPONIN I (HIGH SENSITIVITY)  TROPONIN I (HIGH SENSITIVITY)    EKG None  Radiology DG Chest 2 View  Result Date: 01/14/2023 CLINICAL DATA:  Chest pain and hypertension. EXAM: CHEST - 2 VIEW COMPARISON:  08/11/2022 FINDINGS: Midline trachea. Normal heart size and mediastinal contours. No pleural effusion or pneumothorax. Clear lungs. EKG lead artifact projects over the left apex. IMPRESSION: No acute cardiopulmonary disease. Electronically Signed   By: Jeronimo Greaves M.D.   On: 01/14/2023 20:17    Procedures Procedures    Medications Ordered in ED Medications  hydrOXYzine (ATARAX) tablet 25 mg (25 mg Oral Given 01/14/23 2136)    ED Course/ Medical Decision Making/ A&P Clinical Course as of 01/14/23 2210  Thu Jan 14, 2023  2123 CBC Normal.  [CF]  2123 Basic metabolic panel(!) Mild hypokalemia.  [CF]  2123 Troponin I (High Sensitivity) Initial and delta troponin are normal.  [CF]  2123 POC urine preg, ED Negative.  [CF]  2207 Patient feeling improved from a chest tightness perspective after Atarax.  Again I have a low suspicion for any ACS or pulmonary embolism at this time.  Will write the patient a prescription for Atarax.  She is safe for discharge. [CF]    Clinical Course User Index [CF] Teressa Lower, PA-C   {   Click here for ABCD2, HEART and other calculators          HEART Score: 1                  PERC Score: 0, PERC Score Interpretation: No need for further workup, as <2% chance of PE.  If no criteria are positive and clinicians pre-test probability is <15%, PERC Rule criteria are satisfied Medical Decision Making [name] is a 42 y.o. female patient who presents to the emergency department today for further evaluation of chest pain.  Patient has had chest pain longer than  24 hours and cardiac enzymes were obtained in triage.  I personally interpreted her lab studies which is highlighted in the ED course.  Patient has negative troponins x 2.  Heart score of 1.  Patient is PERC negative and I have an overall low suspicion for pulmonary embolism at this time.  I will give the patient a dose of Atarax as I think this  is likely anxiety related.  EKG is normal.  I will plan to reassess here in 15 to 20 minutes on Atarax response and plan to discharge home.  Patient feeling better. All questions and concerns addressed. Atarax will be prescribed to go home with.  She is safe for discharge at this time. She will follow up with her PCP.  Amount and/or Complexity of Data Reviewed Labs: ordered. Decision-making details documented in ED Course. Radiology: ordered.  Risk Prescription drug management.   Final Clinical Impression(s) / ED Diagnoses Final diagnoses:  Chest tightness    Rx / DC Orders ED Discharge Orders          Ordered    hydrOXYzine (ATARAX) 25 MG tablet  Every 6 hours        01/14/23 2209              Honor Loh [place], PA-C 01/14/23 2211    Glyn Ade, MD 01/15/23 [phone]

## 2023-01-18 ENCOUNTER — Ambulatory Visit (INDEPENDENT_AMBULATORY_CARE_PROVIDER_SITE_OTHER): Payer: BC Managed Care – PPO | Admitting: Family Medicine

## 2023-01-18 ENCOUNTER — Encounter: Payer: Self-pay | Admitting: Family Medicine

## 2023-01-18 VITALS — BP 129/88 | HR 98 | Ht 66.0 in | Wt 185.0 lb

## 2023-01-18 DIAGNOSIS — I10 Essential (primary) hypertension: Secondary | ICD-10-CM

## 2023-01-18 DIAGNOSIS — F419 Anxiety disorder, unspecified: Secondary | ICD-10-CM | POA: Diagnosis not present

## 2023-01-18 MED ORDER — HYDROXYZINE HCL 25 MG PO TABS
25.0000 mg | ORAL_TABLET | Freq: Four times a day (QID) | ORAL | 2 refills | Status: AC
Start: 2023-01-18 — End: 2023-04-18

## 2023-01-18 MED ORDER — OLMESARTAN MEDOXOMIL 20 MG PO TABS
20.0000 mg | ORAL_TABLET | Freq: Every day | ORAL | 0 refills | Status: DC
Start: 2023-01-18 — End: 2023-04-22

## 2023-01-18 NOTE — Assessment & Plan Note (Signed)
Continue taking hydroxyzine 25 mg every 6 hours as needed for anxiety

## 2023-01-18 NOTE — Patient Instructions (Addendum)
I appreciate the opportunity to provide care to you today!    Follow up:  3 months  Labs: BMP  Anxiety can lead to increased blood pressure through several physiological and psychological mechanisms:  -Sympathetic Nervous System Activation: Anxiety activates the body's "fight-or-flight" response, which stimulates the sympathetic nervous system. This activation results in the release of stress hormones, such as adrenaline (epinephrine) and cortisol. These hormones cause blood vessels to constrict and the heart rate to increase, leading to higher blood pressure.  -Increased Heart Rate: Anxiety often causes an elevated heart rate (tachycardia). As the heart pumps faster, it exerts more force on the arterial walls, contributing to increased blood pressure.  -Vascular Resistance: Stress and anxiety can cause blood vessels to constrict, increasing vascular resistance. This heightened resistance forces the heart to work harder to circulate blood, thereby raising blood pressure.  Hormonal Changes: Anxiety leads to an increase in stress hormones like cortisol, which can affect various bodily systems, including those that regulate blood pressure. Chronic elevation of these hormones can contribute to sustained high blood pressure.  Behavioral Factors: Anxiety can lead to behaviors that indirectly raise blood pressure, such as poor diet, lack of exercise, smoking, and excessive alcohol consumption. These factors can exacerbate the effects of anxiety on blood pressure.  Chronic Stress: Long-term or chronic anxiety can lead to persistent activation of the body's stress response systems. Over time, this chronic stress can contribute to sustained increases in blood pressure and increase the risk of developing hypertension.   Hypertension Management   continue taking olmesartan 20 mg daily   Medication Instructions: Take your blood pressure medication at the same time each day. After taking your medication,  check your blood pressure at least an hour later. If your first reading is >140/90 mmHg, wait at least 10 minutes and recheck your blood pressure.. Diet and Lifestyle: Adhere to a low-sodium diet, limiting intake to less than 1500 mg daily, and increase your physical activity. Avoid over-the-counter NSAIDs such as ibuprofen and naproxen while on this medication. Hydration and Nutrition: Stay well-hydrated by drinking at least 64 ounces of water daily. Increase your servings of fruits and vegetables and avoid excessive sodium in your diet. Long-Term Considerations: Uncontrolled hypertension can increase the risk of cardiovascular diseases, including stroke, coronary artery disease, and heart failure.     Please continue to a heart-healthy diet and increase your physical activities. Try to exercise for at least five days a week.    It was a pleasure to see you and I look forward to continuing to work together on your health and well-being. Please do not hesitate to call the office if you need care or have questions about your care.  In case of emergency, please visit the Emergency Department for urgent care, or contact our clinic at [phone] to schedule an appointment. We're here to help you!   Have a wonderful day and week. With Gratitude, Gilmore Laroche MSN, FNP-BC

## 2023-01-18 NOTE — Progress Notes (Signed)
Established Patient Office Visit  Subjective:  Patient ID: [name], female    DOB: 05-Jan-1981  Age: 42 y.o. MRN: [id number]  CC:  Chief Complaint  Patient presents with   Hypertension    Following up for bp.     HPI [name] is a 42 y.o. female with past medical history of primary hypertension presents for blood pressure follow-up. For the details of today's visit, please refer to the assessment and plan.     Past Medical History:  Diagnosis Date   Hypertension     Past Surgical History:  Procedure Laterality Date   TUBAL LIGATION      Family History  Problem Relation Age of Onset   Breast cancer Maternal Aunt    Breast cancer Cousin     Social History   Socioeconomic History   Marital status: Single    Spouse name: Not on file   Number of children: Not on file   Years of education: Not on file   Highest education level: Not on file  Occupational History   Not on file  Tobacco Use   Smoking status: Some Days    Current packs/day: 0.25    Types: Cigarettes   Smokeless tobacco: Never   Tobacco comments:    6 a day  Vaping Use   Vaping status: Former  Substance and Sexual Activity   Alcohol use: Yes    Alcohol/week: 5.0 standard drinks of alcohol    Types: 5 Shots of liquor per week    Comment: daily   Drug use: Yes    Types: Marijuana    Comment: occ; last use a few days ago.   Sexual activity: Yes    Birth control/protection: Surgical  Other Topics Concern   Not on file  Social History Narrative   Not on file   Social Determinants of Health   Financial Resource Strain: Not on file  Food Insecurity: Not on file  Transportation Needs: Not on file  Physical Activity: Not on file  Stress: Not on file  Social Connections: Not on file  Intimate Partner Violence: Not on file    Outpatient Medications Prior to Visit  Medication Sig Dispense Refill   clonazePAM (KLONOPIN) 0.5 MG tablet Take 1 tablet (0.5 mg total) by mouth as needed  for anxiety. Max:4mg /day 10 tablet 0   cyclobenzaprine (FLEXERIL) 5 MG tablet Take 1 tablet (5 mg total) by mouth 3 (three) times daily as needed for muscle spasms. 30 tablet 1   ELDERBERRY PO Take by mouth.     gabapentin (NEURONTIN) 100 MG capsule Take 1 capsule (100 mg total) by mouth at bedtime. 30 capsule 1   hydrocortisone 2.5 % cream Apply topically 2 (two) times daily. 30 g 0   tretinoin (RETIN-A) 0.025 % cream Apply topically at bedtime. 45 g 0   VITAMIN D PO Take by mouth.     Vitamin D, Ergocalciferol, (DRISDOL) 1.25 MG (50000 UNIT) CAPS capsule Take 1 capsule (50,000 Units total) by mouth every 7 (seven) days. 20 capsule 1   Zinc Sulfate (ZINC 15 PO) Take by mouth.     hydrOXYzine (ATARAX) 25 MG tablet Take 1 tablet (25 mg total) by mouth every 6 (six) hours. 12 tablet 0   olmesartan (BENICAR) 20 MG tablet Take 1 tablet (20 mg total) by mouth daily. 30 tablet 0   No facility-administered medications prior to visit.    No Known Allergies  ROS Review of Systems  Constitutional:  Negative for chills and fever.  Eyes:  Negative for visual disturbance.  Respiratory:  Negative for chest tightness and shortness of breath.   Neurological:  Negative for dizziness and headaches.      Objective:    Physical Exam HENT:     Head: Normocephalic.     Mouth/Throat:     Mouth: Mucous membranes are moist.  Cardiovascular:     Rate and Rhythm: Normal rate.     Heart sounds: Normal heart sounds.  Pulmonary:     Effort: Pulmonary effort is normal.     Breath sounds: Normal breath sounds.  Neurological:     Mental Status: She is alert.     BP 129/88   Pulse 98   Ht 5\' 6"  (1.676 m)   Wt 185 lb 0.6 oz (83.9 kg)   SpO2 97%   BMI 29.87 kg/m  Wt Readings from Last 3 Encounters:  01/18/23 185 lb 0.6 oz (83.9 kg)  01/07/23 183 lb 0.6 oz (83 kg)  12/07/22 181 lb 0.6 oz (82.1 kg)    Lab Results  Component Value Date   TSH 1.700 09/08/2022   Lab Results  Component Value Date    WBC 9.2 01/14/2023   HGB 13.6 01/14/2023   HCT 39.3 01/14/2023   MCV 97.8 01/14/2023   PLT 288 01/14/2023   Lab Results  Component Value Date   NA 136 01/14/2023   K 3.0 (L) 01/14/2023   CO2 27 01/14/2023   GLUCOSE 92 01/14/2023   BUN 6 01/14/2023   CREATININE 0.57 01/14/2023   BILITOT 0.9 09/08/2022   ALKPHOS 65 09/08/2022   AST 20 09/08/2022   ALT 17 09/08/2022   PROT 7.2 09/08/2022   ALBUMIN 4.3 09/08/2022   CALCIUM 8.7 (L) 01/14/2023   ANIONGAP 10 01/14/2023   EGFR 112 10/15/2022   Lab Results  Component Value Date   CHOL 185 09/08/2022   Lab Results  Component Value Date   HDL 81 09/08/2022   Lab Results  Component Value Date   LDLCALC 87 09/08/2022   Lab Results  Component Value Date   TRIG 93 09/08/2022   Lab Results  Component Value Date   CHOLHDL 2.3 09/08/2022   Lab Results  Component Value Date   HGBA1C 4.9 09/08/2022      Assessment & Plan:  Primary hypertension Assessment & Plan: Controlled Explained that her elevated blood pressure was likely related to her anxiety.She reported that since initiating hydroxyzine 25 mg, her blood pressure has been well-controlled.  She has been advised to continue taking olmesartan 20 mg daily, and a refill has been sent to her pharmacy. Additionally, a low-sodium diet and increased physical activity were recommended. The patient has verbalized understanding of these recommendations. BP Readings from Last 3 Encounters:  01/18/23 129/88  01/14/23 (!) 144/97  01/07/23 (!) 130/92     Orders: -     Olmesartan Medoxomil; Take 1 tablet (20 mg total) by mouth daily.  Dispense: 90 tablet; Refill: 0 -     BMP8+EGFR  Anxiety Assessment & Plan: Continue taking hydroxyzine 25 mg every 6 hours as needed for anxiety  Orders: -     hydrOXYzine HCl; Take 1 tablet (25 mg total) by mouth every 6 (six) hours.  Dispense: 120 tablet; Refill: 2  Note: This chart has been completed using Engineer, civil (consulting)  software, and while attempts have been made to ensure accuracy, certain words and phrases may not be transcribed as intended.  Follow-up: Return in about 3 months (around 04/19/2023).   Gilmore Laroche, FNP

## 2023-01-18 NOTE — Assessment & Plan Note (Addendum)
Controlled Explained that her elevated blood pressure was likely related to her anxiety.She reported that since initiating hydroxyzine 25 mg, her blood pressure has been well-controlled.  She has been advised to continue taking olmesartan 20 mg daily, and a refill has been sent to her pharmacy. Additionally, a low-sodium diet and increased physical activity were recommended. The patient has verbalized understanding of these recommendations. BP Readings from Last 3 Encounters:  01/18/23 129/88  01/14/23 (!) 144/97  01/07/23 (!) 130/92

## 2023-01-19 LAB — BMP8+EGFR
BUN/Creatinine Ratio: 8 — ABNORMAL LOW (ref 9–23)
BUN: 6 mg/dL (ref 6–24)
CO2: 26 mmol/L (ref 20–29)
Calcium: 9.5 mg/dL (ref 8.7–10.2)
Chloride: 101 mmol/L (ref 96–106)
Creatinine, Ser: 0.79 mg/dL (ref 0.57–1.00)
Glucose: 79 mg/dL (ref 70–99)
Potassium: 3.9 mmol/L (ref 3.5–5.2)
Sodium: 140 mmol/L (ref 134–144)
eGFR: 95 mL/min/{1.73_m2} (ref >59)

## 2023-02-03 ENCOUNTER — Ambulatory Visit: Payer: BC Managed Care – PPO | Admitting: Family Medicine

## 2023-03-15 ENCOUNTER — Ambulatory Visit (INDEPENDENT_AMBULATORY_CARE_PROVIDER_SITE_OTHER): Payer: BC Managed Care – PPO | Admitting: Family Medicine

## 2023-03-15 ENCOUNTER — Encounter: Payer: Self-pay | Admitting: Family Medicine

## 2023-03-15 VITALS — BP 138/85 | HR 90 | Ht 66.0 in | Wt 195.0 lb

## 2023-03-15 DIAGNOSIS — R7301 Impaired fasting glucose: Secondary | ICD-10-CM

## 2023-03-15 DIAGNOSIS — M25551 Pain in right hip: Secondary | ICD-10-CM | POA: Diagnosis not present

## 2023-03-15 DIAGNOSIS — E7849 Other hyperlipidemia: Secondary | ICD-10-CM

## 2023-03-15 DIAGNOSIS — E559 Vitamin D deficiency, unspecified: Secondary | ICD-10-CM

## 2023-03-15 DIAGNOSIS — I10 Essential (primary) hypertension: Secondary | ICD-10-CM | POA: Diagnosis not present

## 2023-03-15 DIAGNOSIS — E038 Other specified hypothyroidism: Secondary | ICD-10-CM

## 2023-03-15 NOTE — Patient Instructions (Addendum)
I appreciate the opportunity to provide care to you today!    Follow up:  3 months  Labs: please stop by the lab during the week to get your blood drawn (CBC, CMP, TSH, Lipid profile, HgA1c, Vit D)   Managing right hip pain through non-pharmacological interventions can be effective and may include the following approaches: -Physical Therapy: A tailored exercise program can strengthen the hip muscles, improve flexibility, and enhance joint stability. -Stretching and Strengthening Exercises: Incorporating gentle stretching routines and strengthening exercises targeting the hip and surrounding muscles can help alleviate pain and improve function. -Heat and Cold Therapy: Applying heat can relax muscles and improve blood flow, while cold packs can reduce inflammation and numb sharp pain. -Massage Therapy: Gentle massage can relieve tension in the muscles around the hip and improve circulation. -Weight Management: Maintaining a healthy weight can reduce stress on the hip joint and alleviate pain. -Posture and Ergonomics: Paying attention to posture during daily activities and using ergonomic furniture can help reduce strain on the hip. -Diet and Nutrition: A balanced diet rich in anti-inflammatory foods may support overall joint health.   Referrals today-  PT  Attached with your AVS, you will find valuable resources for self-education. I highly recommend dedicating some time to thoroughly examine them.   Please continue to a heart-healthy diet and increase your physical activities. Try to exercise for at least five days a week.    It was a pleasure to see you and I look forward to continuing to work together on your health and well-being. Please do not hesitate to call the office if you need care or have questions about your care.  In case of emergency, please visit the Emergency Department for urgent care, or contact our clinic at [phone] to schedule an appointment. We're here to  help you!   Have a wonderful day and week. With Gratitude, Gilmore Laroche MSN, FNP-BC

## 2023-03-15 NOTE — Progress Notes (Unsigned)
Established Patient Office Visit  Subjective:  Patient ID: [name], female    DOB: [birth year]  Age: 42 y.o. MRN: [id number]  CC:  Chief Complaint  Patient presents with   Care Management    3 month f/u   Hip Pain    Pt reports hip pain on right side would like to do physical therapy.     HPI [name] is a 42 y.o. female with past medical history of hypertension right hip pain presents for f/u of  chronic medical conditions. For the details of today's visit, please refer to the assessment and plan.    Past Medical History:  Diagnosis Date   Hypertension     Past Surgical History:  Procedure Laterality Date   TUBAL LIGATION      Family History  Problem Relation Age of Onset   Breast cancer Maternal Aunt    Breast cancer Cousin     Social History   Socioeconomic History   Marital status: Single    Spouse name: Not on file   Number of children: Not on file   Years of education: Not on file   Highest education level: Not on file  Occupational History   Not on file  Tobacco Use   Smoking status: Some Days    Current packs/day: 0.25    Types: Cigarettes   Smokeless tobacco: Never   Tobacco comments:    6 a day  Vaping Use   Vaping status: Former  Substance and Sexual Activity   Alcohol use: Yes    Alcohol/week: 5.0 standard drinks of alcohol    Types: 5 Shots of liquor per week    Comment: daily   Drug use: Yes    Types: Marijuana    Comment: occ; last use a few days ago.   Sexual activity: Yes    Birth control/protection: Surgical  Other Topics Concern   Not on file  Social History Narrative   Not on file   Social Determinants of Health   Financial Resource Strain: Not on file  Food Insecurity: Not on file  Transportation Needs: Not on file  Physical Activity: Not on file  Stress: Not on file  Social Connections: Not on file  Intimate Partner Violence: Not on file    Outpatient Medications Prior to Visit  Medication Sig Dispense  Refill   clonazePAM (KLONOPIN) 0.5 MG tablet Take 1 tablet (0.5 mg total) by mouth as needed for anxiety. Max:4mg /day 10 tablet 0   ELDERBERRY PO Take by mouth.     gabapentin (NEURONTIN) 100 MG capsule Take 1 capsule (100 mg total) by mouth at bedtime. 30 capsule 1   hydrocortisone 2.5 % cream Apply topically 2 (two) times daily. 30 g 0   hydrOXYzine (ATARAX) 25 MG tablet Take 1 tablet (25 mg total) by mouth every 6 (six) hours. 120 tablet 2   olmesartan (BENICAR) 20 MG tablet Take 1 tablet (20 mg total) by mouth daily. 90 tablet 0   tretinoin (RETIN-A) 0.025 % cream Apply topically at bedtime. 45 g 0   VITAMIN D PO Take by mouth.     Vitamin D, Ergocalciferol, (DRISDOL) 1.25 MG (50000 UNIT) CAPS capsule Take 1 capsule (50,000 Units total) by mouth every 7 (seven) days. 20 capsule 1   Zinc Sulfate (ZINC 15 PO) Take by mouth.     cyclobenzaprine (FLEXERIL) 5 MG tablet Take 1 tablet (5 mg total) by mouth 3 (three) times daily as needed for muscle spasms.  30 tablet 1   No facility-administered medications prior to visit.    No Known Allergies  ROS Review of Systems  Constitutional:  Negative for chills and fever.  Eyes:  Negative for visual disturbance.  Respiratory:  Negative for chest tightness and shortness of breath.   Neurological:  Negative for dizziness and headaches.      Objective:    Physical Exam HENT:     Head: Normocephalic.     Mouth/Throat:     Mouth: Mucous membranes are moist.  Cardiovascular:     Rate and Rhythm: Normal rate.     Heart sounds: Normal heart sounds.  Pulmonary:     Effort: Pulmonary effort is normal.     Breath sounds: Normal breath sounds.  Neurological:     Mental Status: She is alert.     BP 138/85   Pulse 90   Ht 5\' 6"  (1.676 m)   Wt 195 lb (88.5 kg)   SpO2 97%   BMI 31.47 kg/m  Wt Readings from Last 3 Encounters:  03/15/23 195 lb (88.5 kg)  01/18/23 185 lb 0.6 oz (83.9 kg)  01/07/23 183 lb 0.6 oz (83 kg)    Lab Results   Component Value Date   TSH 1.700 09/08/2022   Lab Results  Component Value Date   WBC 9.2 01/14/2023   HGB 13.6 01/14/2023   HCT 39.3 01/14/2023   MCV 97.8 01/14/2023   PLT 288 01/14/2023   Lab Results  Component Value Date   NA 140 01/18/2023   K 3.9 01/18/2023   CO2 26 01/18/2023   GLUCOSE 79 01/18/2023   BUN 6 01/18/2023   CREATININE 0.79 01/18/2023   BILITOT 0.9 09/08/2022   ALKPHOS 65 09/08/2022   AST 20 09/08/2022   ALT 17 09/08/2022   PROT 7.2 09/08/2022   ALBUMIN 4.3 09/08/2022   CALCIUM 9.5 01/18/2023   ANIONGAP 10 01/14/2023   EGFR 95 01/18/2023   Lab Results  Component Value Date   CHOL 185 09/08/2022   Lab Results  Component Value Date   HDL 81 09/08/2022   Lab Results  Component Value Date   LDLCALC 87 09/08/2022   Lab Results  Component Value Date   TRIG 93 09/08/2022   Lab Results  Component Value Date   CHOLHDL 2.3 09/08/2022   Lab Results  Component Value Date   HGBA1C 4.9 09/08/2022      Assessment & Plan:  Primary hypertension Assessment & Plan: The patient reports episodes of elevated diastolic blood pressure without symptoms such as headaches, dizziness, blurred vision, chest pain, or palpitations. Her blood pressure is well-controlled in the clinic today. She was encouraged to check her blood pressure daily at home and to report if it remains consistently elevated. A low-sodium diet and increased physical activity were also encouraged.   BP Readings from Last 3 Encounters:  03/15/23 138/85  01/18/23 129/88  01/14/23 (!) 144/97          Right hip pain Assessment & Plan: The patient reports lateral right hip pain described as aching, which worsens when lying down or during prolonged sitting. She denies any recent injury or trauma. She does report occasional numbness and tingling down her lower leg, attributed to her neuropathy. A referral has been placed for physical therapy to focus on strengthening exercises. Imaging  studies will be obtained to rule out degenerative changes of the right hip and any underlying pathology. She was encouraged to rest, apply heat to the affected  area, avoid activities that aggravate the pain, and continue taking over-the-counter analgesics as needed for pain relief.The patient is ambulating well in the clinic.         Orders: -     DG HIP UNILAT W OR W/O PELVIS 2-3 VIEWS RIGHT -     Ambulatory referral to Physical Therapy  IFG (impaired fasting glucose) -     Hemoglobin A1c  Vitamin D deficiency -     VITAMIN D 25 Hydroxy (Vit-D Deficiency, Fractures)  TSH (thyroid-stimulating hormone deficiency) -     TSH + free T4  Other hyperlipidemia -     Lipid panel -     CMP14+EGFR -     CBC with Differential/Platelet  Note: This chart has been completed using Engineer, civil (consulting) software, and while attempts have been made to ensure accuracy, certain words and phrases may not be transcribed as intended.    Follow-up: Return in about 3 months (around 06/15/2023).   Gilmore Laroche, FNP

## 2023-03-16 ENCOUNTER — Ambulatory Visit (HOSPITAL_COMMUNITY)
Admission: RE | Admit: 2023-03-16 | Discharge: 2023-03-16 | Disposition: A | Discharge status: Home / Self Care | Payer: BC Managed Care – PPO | Source: Ambulatory Visit | Attending: Family Medicine | Admitting: Family Medicine

## 2023-03-16 ENCOUNTER — Telehealth: Payer: Self-pay | Admitting: Family Medicine

## 2023-03-16 DIAGNOSIS — M160 Bilateral primary osteoarthritis of hip: Secondary | ICD-10-CM | POA: Diagnosis not present

## 2023-03-16 DIAGNOSIS — M25551 Pain in right hip: Secondary | ICD-10-CM | POA: Diagnosis not present

## 2023-03-16 NOTE — Telephone Encounter (Signed)
Spoke with pt states she went in throught the Emergency dept to have this done , advised she needed to go in throught the main entrance.

## 2023-03-16 NOTE — Telephone Encounter (Signed)
Patient was seen yesterday and she went to the radiology department to have xray of right hip and no order entered. Needs th order entered. Call patient when ready.

## 2023-03-16 NOTE — Assessment & Plan Note (Addendum)
The patient reports lateral right hip pain described as aching, which worsens when lying down or during prolonged sitting. She denies any recent injury or trauma. She does report occasional numbness and tingling down her lower leg, attributed to her neuropathy. A referral has been placed for physical therapy to focus on strengthening exercises. Imaging studies will be obtained to rule out degenerative changes of the right hip and any underlying pathology. She was encouraged to rest, apply heat to the affected area, avoid activities that aggravate the pain, and continue taking over-the-counter analgesics as needed for pain relief.The patient is ambulating well in the clinic.

## 2023-03-16 NOTE — Assessment & Plan Note (Signed)
The patient reports episodes of elevated diastolic blood pressure without symptoms such as headaches, dizziness, blurred vision, chest pain, or palpitations. Her blood pressure is well-controlled in the clinic today. She was encouraged to check her blood pressure daily at home and to report if it remains consistently elevated. A low-sodium diet and increased physical activity were also encouraged.   BP Readings from Last 3 Encounters:  03/15/23 138/85  01/18/23 129/88  01/14/23 (!) 144/97

## 2023-03-17 ENCOUNTER — Encounter: Payer: Self-pay | Admitting: Family Medicine

## 2023-03-24 ENCOUNTER — Encounter (HOSPITAL_COMMUNITY): Payer: Self-pay

## 2023-03-24 ENCOUNTER — Other Ambulatory Visit: Payer: Self-pay

## 2023-03-24 ENCOUNTER — Ambulatory Visit (HOSPITAL_COMMUNITY): Payer: BC Managed Care – PPO | Attending: Family Medicine

## 2023-03-24 DIAGNOSIS — M25551 Pain in right hip: Secondary | ICD-10-CM | POA: Insufficient documentation

## 2023-03-24 NOTE — Progress Notes (Deleted)
OUTPATIENT PHYSICAL THERAPY LOWER EXTREMITY EVALUATION   Patient Name: [name] MRN: [id number] DOB:December 21, 1980, 42 y.o., female Today's Date: 03/24/2023  END OF SESSION:   Past Medical History:  Diagnosis Date   Hypertension    Past Surgical History:  Procedure Laterality Date   TUBAL LIGATION     Patient Active Problem List   Diagnosis Date Noted   Right hip pain 03/16/2023   Anxiety 01/18/2023   Hyperpigmentation of skin 12/11/2022   Neuropathy 11/02/2022   Panic attack as reaction to stress 10/15/2022   Bipolar 1 disorder (HCC) 10/15/2022   Cervical cancer screening 09/30/2022   Rash in adult 09/08/2022   Primary hypertension 09/08/2022   Nephrolithiasis 10/30/2019   Pyelonephritis 10/30/2019   Tobacco abuse 10/30/2019    PCP: Gilmore Laroche, FNP  REFERRING PROVIDER: Gilmore Laroche, FNP  REFERRING DIAG:  [phone] (ICD-10-CM) - Right hip pain    THERAPY DIAG:  No diagnosis found.  Rationale for Evaluation and Treatment: {HABREHAB:27488}  ONSET DATE: ***  SUBJECTIVE:   SUBJECTIVE STATEMENT: The patient reports lateral right hip pain described as aching, which worsens when lying down or during prolonged sitting. She denies any recent injury or trauma. She does report occasional numbness and tingling down her lower leg, attributed to her neuropathy.   PERTINENT HISTORY: X-ray of right hip on 03/16/23; results not available at evaluation date.  PAIN:  Are you having pain? {OPRCPAIN:27236}  PRECAUTIONS: {Therapy precautions:24002}  RED FLAGS: {PT Red Flags:29287}   WEIGHT BEARING RESTRICTIONS: {Yes ***/No:24003}  FALLS:  Has patient fallen in last 6 months? {fallsyesno:27318}  LIVING ENVIRONMENT: Lives with: {OPRC lives with:25569::"lives with their family"} Lives in: {Lives in:25570} Stairs: {opstairs:27293} Has following equipment at home: {Assistive devices:23999}  OCCUPATION: ***  PLOF: {PLOF:24004}  PATIENT GOALS: ***  NEXT MD  VISIT: ***  OBJECTIVE:  Note: Objective measures were completed at Evaluation unless otherwise noted.  DIAGNOSTIC FINDINGS: ***  PATIENT SURVEYS:  {rehab surveys:24030}  COGNITION: Overall cognitive status: {cognition:24006}     SENSATION: {sensation:27233}  EDEMA:  {edema:24020}  MUSCLE LENGTH: Hamstrings: Right *** deg; Left *** deg Maisie Fus test: Right *** deg; Left *** deg  POSTURE: {posture:25561}  PALPATION: ***  LOWER EXTREMITY ROM:  {AROM/PROM:27142} ROM Right eval Left eval  Hip flexion    Hip extension    Hip abduction    Hip adduction    Hip internal rotation    Hip external rotation    Knee flexion    Knee extension    Ankle dorsiflexion    Ankle plantarflexion    Ankle inversion    Ankle eversion     (Blank rows = not tested)  LOWER EXTREMITY MMT:  MMT Right eval Left eval  Hip flexion    Hip extension    Hip abduction    Hip adduction    Hip internal rotation    Hip external rotation    Knee flexion    Knee extension    Ankle dorsiflexion    Ankle plantarflexion    Ankle inversion    Ankle eversion     (Blank rows = not tested)  LOWER EXTREMITY SPECIAL TESTS:  {LEspecialtests:26242}  FUNCTIONAL TESTS:  {Functional tests:24029}  GAIT: Distance walked: *** Assistive device utilized: {Assistive devices:23999} Level of assistance: {Levels of assistance:24026} Comments: ***   TODAY'S TREATMENT:  DATE:   03/24/2023    PATIENT EDUCATION:  Education details: *** Person educated: {Person educated:25204} Education method: {Education Method:25205} Education comprehension: {Education Comprehension:25206}  HOME EXERCISE PROGRAM: ***  ASSESSMENT:  CLINICAL IMPRESSION: Patient is a 42 y.o. female who was seen today for physical therapy evaluation and treatment for right hip pain.   Patient demonstrates  muscle weakness, reduced ROM, and fascial restrictions which are likely contributing to symptoms of pain and are negatively impacting patient ability to perform ADLs and functional mobility tasks. Patient will benefit from skilled physical therapy services to address these deficits to reduce pain and improve level of function with ADLs and functional mobility tasks.   OBJECTIVE IMPAIRMENTS: {opptimpairments:25111}.   ACTIVITY LIMITATIONS: {activitylimitations:27494}  PARTICIPATION LIMITATIONS: {participationrestrictions:25113}  PERSONAL FACTORS: {Personal factors:25162} are also affecting patient's functional outcome.   REHAB POTENTIAL: {rehabpotential:25112}  CLINICAL DECISION MAKING: {clinical decision making:25114}  EVALUATION COMPLEXITY: {Evaluation complexity:25115}   GOALS: Goals reviewed with patient? {yes/no:20286}  SHORT TERM GOALS: Target date: *** *** Baseline: Goal status: INITIAL  2.  *** Baseline:  Goal status: INITIAL  3.  *** Baseline:  Goal status: INITIAL   LONG TERM GOALS: Target date: ***  *** Baseline:  Goal status: INITIAL  2.  *** Baseline:  Goal status: INITIAL  3.  *** Baseline:  Goal status: INITIAL  4.  *** Baseline:  Goal status: INITIAL   PLAN:  PT FREQUENCY: {rehab frequency:25116}  PT DURATION: {rehab duration:25117}  PLANNED INTERVENTIONS: {rehab planned interventions:25118::"97110-Therapeutic exercises","97530- Therapeutic [phone]- Neuromuscular re-education","97535- Self IONG","29528- Manual therapy"}  PLAN FOR NEXT SESSION: ***   Chas Lusero Nordlund, Student-PT 03/24/2023, 9:56 AM

## 2023-03-24 NOTE — Patient Instructions (Signed)

## 2023-03-24 NOTE — Therapy (Cosign Needed Addendum)
OUTPATIENT PHYSICAL THERAPY LOWER EXTREMITY EVALUATION   Patient Name: [name] MRN: [id number] DOB:1981-01-24, 42 y.o., female Today's Date: 03/24/2023  END OF SESSION:  PT End of Session - 03/24/23 1123     Visit Number 1    Number of Visits 12    Date for PT Re-Evaluation 05/05/23    Authorization Type BL[employer]    PT Start Time 1124    PT Stop Time 1200    PT Time Calculation (min) 36 min    Activity Tolerance Patient tolerated treatment well    Behavior During Therapy [facility] for tasks assessed/performed             Past Medical History:  Diagnosis Date   Hypertension    Past Surgical History:  Procedure Laterality Date   TUBAL LIGATION     Patient Active Problem List   Diagnosis Date Noted   Right hip pain 03/16/2023   Anxiety 01/18/2023   Hyperpigmentation of skin 12/11/2022   Neuropathy 11/02/2022   Panic attack as reaction to stress 10/15/2022   Bipolar 1 disorder (HCC) 10/15/2022   Cervical cancer screening 09/30/2022   Rash in adult 09/08/2022   Primary hypertension 09/08/2022   Nephrolithiasis 10/30/2019   Pyelonephritis 10/30/2019   Tobacco abuse 10/30/2019    PCP: Gilmore Laroche, FNP  REFERRING PROVIDER: Gilmore Laroche, FNP  REFERRING DIAG: [phone] (ICD-10-CM) - Right hip pain  THERAPY DIAG:  Pain in right hip  Rationale for Evaluation and Treatment: Rehabilitation  ONSET DATE: roughly 2 years ago  SUBJECTIVE:   SUBJECTIVE STATEMENT: EVAL: Patient is an Education officer, 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 She presents to the clinic for right hip pain for roughly 2 years. She denies any pain during home care activities. Most painful during long car rides. Pain will radiate down leg into her shin when sitting for too long but denies N/T. Will have get out of car to stretch and walk around for pain relief. Putting socks on is painful. Denies pain when squatting. Notices pressure when standing on right side.  Current pain 2/10 after sitting for subjective intake.   PERTINENT HISTORY: X-ray of right hip on 03/17/23; results not available at evaluation date.  PAIN:  Are you having pain? Yes: NPRS scale: 2/10 Pain location: lateral hip Pain description: pressure and ache Aggravating factors: prolonged sitting, car rides, lying on right hip, walking for long periods  Relieving factors: rest  PRECAUTIONS: None  WEIGHT BEARING RESTRICTIONS: No  FALLS:  Has patient fallen in last 6 months? No   OCCUPATION: International aid/development worker at [employer]  PLOF: Independent with basic ADLs  PATIENT GOALS: Be able to work without pain. Decrease pain throughout the day.   NEXT MD VISIT: PRN after PT; 3 months following referral date  OBJECTIVE:  Note: Objective measures were completed at Evaluation unless otherwise noted.  DIAGNOSTIC FINDINGS: none yet; check x-ray results when posted  PATIENT SURVEYS:  FOTO 78  COGNITION: Overall cognitive status: Within functional limits for tasks assessed     SENSATION: WFL  POSTURE: rounded shoulders, forward head, and weight shift left  PALPATION: Tender to right greater trochanter; trigger points in glute med and glute max muscle belly. No pain or trigger points throughout anterior or posterior thigh.   LOWER EXTREMITY ROM:  Active ROM Right eval Left eval  Hip flexion 95 WNL  Hip extension limited WNL  Hip abduction WNL WNL  Hip adduction    Hip  internal rotation 32 WNL  Hip external rotation 15* WNL  Knee flexion    Knee extension    Ankle dorsiflexion    Ankle plantarflexion    Ankle inversion    Ankle eversion     (Blank rows = not tested); * = painful  LOWER EXTREMITY MMT:  MMT Right eval Left eval  Hip flexion 4+ 5  Hip extension 4 5  Hip abduction 4* 5  Hip adduction    Hip internal rotation 4- 5  Hip external rotation 4 5  Knee flexion 4 5  Knee extension 4 5  Ankle dorsiflexion    Ankle plantarflexion    Ankle inversion     Ankle eversion     (Blank rows = not tested)  LOWER EXTREMITY SPECIAL TESTS:  Hip special tests: Luisa Hart (FABER) test: positive , Hip scouring test: negative, and Resisted external derotation test: positive  FUNCTIONAL TESTS:  5 times sit to stand: 14.65 sec Time seated until pain increase: 15 minutes at evaluation   GAIT: Distance walked: 100 ft in clinic Assistive device utilized: None Level of assistance: Complete Independence   TODAY'S TREATMENT:                                                                                                                              DATE: 03/24/23 physical therapy evaluation and HEP instruction    PATIENT EDUCATION:  Education details: Patient educated on exam findings, POC, scope of PT, HEP, and activity modifications during work and ADLs. Person educated: Patient Education method: Explanation, Demonstration, and Handouts Education comprehension: verbalized understanding, returned demonstration, verbal cues required, and tactile cues required  HOME EXERCISE PROGRAM: Access Code: [email or web address] URL: [email or web address] Date: 03/24/2023 Prepared by: Karna Christmas  Exercises - Prone Hip Extension on Table  - 1 x daily - 7 x weekly - 2 sets - 10 reps - Supine Gluteus Stretch  - 1 x daily - 7 x weekly - 10 reps - 20 hold - Supine Bridge  - 1 x daily - 7 x weekly - 2 sets - 10 reps  ASSESSMENT:  CLINICAL IMPRESSION: Patient is a 42 y.o. female who was seen today for physical therapy evaluation and treatment for M25.551 (ICD-10-CM) - Right hip pain. Thought patient's pain is chronic, it fluctuates with daily activity and position. Due to chronic pain, patient has been limiting use of RLE and demonstrates decreased AROM and strength. Palpation revealed trigger points in the right glute max and med as well as point tenderness to greater trochanter. 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 Patient was educated on activity modifications to decrease aggravating factors. HEP to address hip tightness as well as glute strengthening. Patient will benefit from skilled Physical Therapy services to address functional limitations, decrease pain, and promote optimal return to function.  OBJECTIVE IMPAIRMENTS: decreased mobility, decreased ROM, and decreased strength.   ACTIVITY LIMITATIONS: sitting and sleeping  PARTICIPATION LIMITATIONS: community activity and occupation  PERSONAL FACTORS: Profession are also affecting patient's functional outcome.   REHAB POTENTIAL: Good  CLINICAL DECISION MAKING: Stable/uncomplicated  EVALUATION COMPLEXITY: Low   GOALS: Goals reviewed with patient? No  SHORT TERM GOALS: Target date: 04/14/2023 Patient will be independent with HEP and pain management strategies in order to increase tolerance to daily activity.  Baseline: Goal status: INITIAL    2.  Patient will report >40% improvement in her symptoms since starting PT in order to better tolerate daily activity. Baseline:  Goal status: INITIAL    LONG TERM GOALS: Target date: 05/05/2023  Patient with demonstrate RLE strength improvements by scoring 4+/5 or 5/5 on more than half of tested MMTs. Baseline:  Goal status: INITIAL  2.  Patient will report >75% improvement in her symptoms since starting PT in order to better tolerate daily activity. Baseline:  Goal status: INITIAL  3.  Patient will improve FOTO score by 10 points by discharge to improve perceived functional capacity. Baseline:  Goal status: INITIAL  4.  By discharge patient will be able to sit for >30 minutes without an increase in pain symptoms in order to tolerate ADLs and leisure. Baseline:  Goal status: INITIAL   PLAN:  PT FREQUENCY: 2x/week  PT DURATION: 6 weeks  PLANNED  INTERVENTIONS: 97164- PT Re-evaluation, 97110-Therapeutic exercises, 97530- Therapeutic activity, 97112- Neuromuscular re-education, 97535- Self Care, 46270- Manual therapy, [phone]- Gait training, [phone]- Orthotic Fit/training, [phone]- Canalith repositioning, [id number]- Aquatic Therapy, [phone]- Splinting, Patient/Family education, Balance training, Stair training, Taping, Dry Needling, Joint mobilization, Joint manipulation, Spinal manipulation, Spinal mobilization, Scar mobilization, and DME instructions.   PLAN FOR NEXT SESSION: Review HEP and goals; discuss response to initial HEP and compliance to activity tolerance. STM to glutes for trigger point release. Test isotonic and isometric exercises for analgesic effect.   2:40 PM, 03/24/23 Karna Christmas, SPT  " I agree with the following treatment note after reviewing documentation. This session was performed under the supervision of a licensed clinician."       2:40 PM, 03/24/23 Amy Small Lynch MPT [facility] [place] [phone]

## 2023-04-08 ENCOUNTER — Emergency Department (HOSPITAL_COMMUNITY)
Admission: EM | Admit: 2023-04-08 | Discharge: 2023-04-08 | Disposition: A | Discharge status: Home / Self Care | Payer: BC Managed Care – PPO | Attending: Emergency Medicine | Admitting: Emergency Medicine

## 2023-04-08 ENCOUNTER — Other Ambulatory Visit: Payer: Self-pay

## 2023-04-08 ENCOUNTER — Emergency Department (HOSPITAL_COMMUNITY): Payer: BC Managed Care – PPO

## 2023-04-08 ENCOUNTER — Encounter (HOSPITAL_COMMUNITY): Payer: Self-pay

## 2023-04-08 DIAGNOSIS — Z79899 Other long term (current) drug therapy: Secondary | ICD-10-CM | POA: Diagnosis not present

## 2023-04-08 DIAGNOSIS — E876 Hypokalemia: Secondary | ICD-10-CM | POA: Insufficient documentation

## 2023-04-08 DIAGNOSIS — N202 Calculus of kidney with calculus of ureter: Secondary | ICD-10-CM | POA: Diagnosis not present

## 2023-04-08 DIAGNOSIS — R109 Unspecified abdominal pain: Secondary | ICD-10-CM | POA: Diagnosis not present

## 2023-04-08 DIAGNOSIS — N201 Calculus of ureter: Secondary | ICD-10-CM | POA: Diagnosis not present

## 2023-04-08 DIAGNOSIS — I10 Essential (primary) hypertension: Secondary | ICD-10-CM | POA: Insufficient documentation

## 2023-04-08 DIAGNOSIS — Q630 Accessory kidney: Secondary | ICD-10-CM | POA: Diagnosis not present

## 2023-04-08 DIAGNOSIS — R9431 Abnormal electrocardiogram [ECG] [EKG]: Secondary | ICD-10-CM | POA: Diagnosis not present

## 2023-04-08 LAB — BASIC METABOLIC PANEL
Anion gap: 11 (ref 5–15)
BUN: 7 mg/dL (ref 6–20)
CO2: 24 mmol/L (ref 22–32)
Calcium: 9.3 mg/dL (ref 8.9–10.3)
Chloride: 101 mmol/L (ref 98–111)
Creatinine, Ser: 0.55 mg/dL (ref 0.44–1.00)
GFR, Estimated: >60 mL/min (ref >60)
Glucose, Bld: 90 mg/dL (ref 70–99)
Potassium: 2.7 mmol/L — CL (ref 3.5–5.1)
Sodium: 136 mmol/L (ref 135–145)

## 2023-04-08 LAB — CBC WITH DIFFERENTIAL/PLATELET
Abs Immature Granulocytes: 0.02 10*3/uL (ref 0.00–0.07)
Basophils Absolute: 0.1 10*3/uL (ref 0.0–0.1)
Basophils Relative: 1 %
Eosinophils Absolute: 0.2 10*3/uL (ref 0.0–0.5)
Eosinophils Relative: 2 %
HCT: 38.6 % (ref 36.0–46.0)
Hemoglobin: 13.3 g/dL (ref 12.0–15.0)
Immature Granulocytes: 0 %
Lymphocytes Relative: 28 %
Lymphs Abs: 2.5 10*3/uL (ref 0.7–4.0)
MCH: 32.8 pg (ref 26.0–34.0)
MCHC: 34.5 g/dL (ref 30.0–36.0)
MCV: 95.3 fL (ref 80.0–100.0)
Monocytes Absolute: 0.5 10*3/uL (ref 0.1–1.0)
Monocytes Relative: 5 %
Neutro Abs: 5.7 10*3/uL (ref 1.7–7.7)
Neutrophils Relative %: 64 %
Platelets: 270 10*3/uL (ref 150–400)
RBC: 4.05 MIL/uL (ref 3.87–5.11)
RDW: 13.2 % (ref 11.5–15.5)
WBC: 8.9 10*3/uL (ref 4.0–10.5)
nRBC: 0 % (ref 0.0–0.2)

## 2023-04-08 LAB — URINALYSIS, ROUTINE W REFLEX MICROSCOPIC
Bilirubin Urine: NEGATIVE
Glucose, UA: NEGATIVE mg/dL
Hgb urine dipstick: NEGATIVE
Ketones, ur: NEGATIVE mg/dL
Nitrite: NEGATIVE
Protein, ur: NEGATIVE mg/dL
Specific Gravity, Urine: 1.011 (ref 1.005–1.030)
pH: 6 (ref 5.0–8.0)

## 2023-04-08 LAB — PREGNANCY, URINE: Preg Test, Ur: NEGATIVE

## 2023-04-08 MED ORDER — HYDROMORPHONE HCL 1 MG/ML IJ SOLN
1.0000 mg | Freq: Once | INTRAMUSCULAR | Status: AC
  Administered 2023-04-08: 1 mg via INTRAVENOUS
  Filled 2023-04-08: qty 1

## 2023-04-08 MED ORDER — ONDANSETRON HCL 4 MG/2ML IJ SOLN
4.0000 mg | Freq: Once | INTRAMUSCULAR | Status: AC
  Administered 2023-04-08: 4 mg via INTRAVENOUS
  Filled 2023-04-08: qty 2

## 2023-04-08 MED ORDER — POTASSIUM CHLORIDE CRYS ER 20 MEQ PO TBCR
20.0000 meq | EXTENDED_RELEASE_TABLET | Freq: Two times a day (BID) | ORAL | 0 refills | Status: DC
Start: 2023-04-08 — End: 2023-05-26

## 2023-04-08 MED ORDER — CEPHALEXIN 500 MG PO CAPS
500.0000 mg | ORAL_CAPSULE | Freq: Four times a day (QID) | ORAL | 0 refills | Status: DC
Start: 2023-04-08 — End: 2023-07-08

## 2023-04-08 MED ORDER — OXYCODONE-ACETAMINOPHEN 5-325 MG PO TABS: 1.0000 | ORAL_TABLET | ORAL | 0 refills | Status: DC | PRN

## 2023-04-08 MED ORDER — POTASSIUM CHLORIDE 10 MEQ/100ML IV SOLN
10.0000 meq | INTRAVENOUS | Status: AC
  Administered 2023-04-08 (×2): 10 meq via INTRAVENOUS
  Filled 2023-04-08 (×2): qty 100

## 2023-04-08 MED ORDER — TAMSULOSIN HCL 0.4 MG PO CAPS: 0.4000 mg | ORAL_CAPSULE | Freq: Every day | ORAL | 0 refills | Status: DC

## 2023-04-08 MED ORDER — SODIUM CHLORIDE 0.9 % IV SOLN
1.0000 g | Freq: Once | INTRAVENOUS | Status: AC
  Administered 2023-04-08: 1 g via INTRAVENOUS
  Filled 2023-04-08: qty 10

## 2023-04-08 MED ORDER — OXYCODONE-ACETAMINOPHEN 5-325 MG PO TABS: 1.0000 | ORAL_TABLET | Freq: Four times a day (QID) | ORAL | 0 refills | Status: DC | PRN

## 2023-04-08 MED ORDER — POTASSIUM CHLORIDE CRYS ER 20 MEQ PO TBCR
40.0000 meq | EXTENDED_RELEASE_TABLET | Freq: Once | ORAL | Status: AC
  Administered 2023-04-08: 40 meq via ORAL
  Filled 2023-04-08: qty 2

## 2023-04-08 NOTE — ED Notes (Signed)
Pt aware we need urine specimen, unable to go at this time

## 2023-04-08 NOTE — ED Provider Notes (Signed)
[name] EMERGENCY DEPARTMENT AT [facility] Provider Note   CSN: [id number] Arrival date & time: 04/08/23  1522     History  Chief Complaint  Patient presents with   Flank Pain    [name] is a 42 y.o. female.   Flank Pain Pertinent negatives include no chest pain, no abdominal pain and no shortness of breath.        [name] is a 42 y.o. female history With past previous pyelonephritis, anxiety, bipolar disorder, hypertension who presents to the Emergency Department complaining of sudden onset of right flank pain shortly before ER arrival.  She describes having some pressure sensation with urination.  Feels like she is unable to completely empty her bladder.  Urinating but small amounts.  Denies fever chills nausea or vomiting and abdominal pain.  States she had similar symptoms several months ago with pyelonephritis.  History of previous kidney stone as well.  Current symptoms feel similar to prior kidney infection.  Denies any vomiting.    Home Medications Prior to Admission medications   Medication Sig Start Date End Date Taking? Authorizing Provider  clonazePAM (KLONOPIN) 0.5 MG tablet Take 1 tablet (0.5 mg total) by mouth as needed for anxiety. Max:4mg /day 11/27/22   Gilmore Laroche, FNP  ELDERBERRY PO Take by mouth.    [provider]  gabapentin (NEURONTIN) 100 MG capsule Take 1 capsule (100 mg total) by mouth at bedtime. 11/02/22   Gilmore Laroche, FNP  hydrocortisone 2.5 % cream Apply topically 2 (two) times daily. 12/14/22   Gilmore Laroche, FNP  hydrOXYzine (ATARAX) 25 MG tablet Take 1 tablet (25 mg total) by mouth every 6 (six) hours. 01/18/23 04/18/23  Gilmore Laroche, FNP  olmesartan (BENICAR) 20 MG tablet Take 1 tablet (20 mg total) by mouth daily. 01/18/23   Gilmore Laroche, FNP  tretinoin (RETIN-A) 0.025 % cream Apply topically at bedtime. 12/07/22   Gilmore Laroche, FNP  VITAMIN D PO Take by mouth.    [provider]   Vitamin D, Ergocalciferol, (DRISDOL) 1.25 MG (50000 UNIT) CAPS capsule Take 1 capsule (50,000 Units total) by mouth every 7 (seven) days. 09/30/22   Gilmore Laroche, FNP  Zinc Sulfate (ZINC 15 PO) Take by mouth.    [provider]      Allergies    Patient has no known allergies.    Review of Systems   Review of Systems  Constitutional:  Negative for chills and fever.  Respiratory:  Negative for shortness of breath.   Cardiovascular:  Negative for chest pain.  Gastrointestinal:  Negative for abdominal pain, diarrhea, nausea and vomiting.  Genitourinary:  Positive for decreased urine volume, dysuria and flank pain. Negative for vaginal bleeding, vaginal discharge and vaginal pain.  Skin:  Negative for rash.  Neurological:  Negative for weakness and numbness.    Physical Exam Updated Vital Signs BP (!) 147/88   Pulse 85   Temp 98 F (36.7 C)   Resp 18   Ht 5\' 6"  (1.676 m)   Wt 86.2 kg   SpO2 99%   BMI 30.67 kg/m  Physical Exam Vitals and nursing note reviewed.  Constitutional:      General: She is not in acute distress.    Appearance: Normal appearance. She is not ill-appearing or toxic-appearing.     Comments: Patient is uncomfortable appearing, slightly tearful  Cardiovascular:     Rate and Rhythm: Normal rate and regular rhythm.     Pulses: Normal pulses.  Pulmonary:  Effort: Pulmonary effort is normal.     Breath sounds: Normal breath sounds.  Abdominal:     Palpations: Abdomen is soft.     Tenderness: There is no abdominal tenderness. There is right CVA tenderness. There is no left CVA tenderness.  Musculoskeletal:        General: Normal range of motion.  Skin:    General: Skin is warm.     Capillary Refill: Capillary refill takes less than 2 seconds.  Neurological:     General: No focal deficit present.     Mental Status: She is alert.     Sensory: No sensory deficit.     Motor: No weakness.     ED Results / Procedures / Treatments    Labs (all labs ordered are listed, but only abnormal results are displayed) Labs Reviewed  BASIC METABOLIC PANEL - Abnormal; Notable for the following components:      Result Value   Potassium 2.7 (*)    All other components within normal limits  URINALYSIS, ROUTINE W REFLEX MICROSCOPIC - Abnormal; Notable for the following components:   APPearance CLOUDY (*)    Leukocytes,Ua MODERATE (*)    Bacteria, UA RARE (*)    All other components within normal limits  URINE CULTURE  CBC WITH DIFFERENTIAL/PLATELET  PREGNANCY, URINE    EKG EKG Interpretation Date/Time:  Thursday April 08 2023 19:38:55 EST Ventricular Rate:  89 PR Interval:  174 QRS Duration:  95 QT Interval:  431 QTC Calculation: 525 R Axis:   58  Text Interpretation: Sinus rhythm Borderline repolarization abnormality Prolonged QT interval Confirmed by Estelle June [phone]) on 04/08/2023 7:48:02 PM  Radiology CT Renal Stone Study  Result Date: 04/08/2023 CLINICAL DATA:  Left flank pain. EXAM: CT ABDOMEN AND PELVIS WITHOUT CONTRAST TECHNIQUE: Multidetector CT imaging of the abdomen and pelvis was performed following the standard protocol without IV contrast. RADIATION DOSE REDUCTION: This exam was performed according to the departmental dose-optimization program which includes automated exposure control, adjustment of the mA and/or kV according to patient size and/or use of iterative reconstruction technique. COMPARISON:  Renal stone CT 05/21/2021. FINDINGS: Lower chest: Slight linear opacity lung bases likely scar or atelectasis. No pleural effusion. Hepatobiliary: No focal liver abnormality is seen. No gallstones, gallbladder wall thickening, or biliary dilatation. Pancreas: Unremarkable. No pancreatic ductal dilatation or surrounding inflammatory changes. Spleen: Normal in size without focal abnormality. Adrenals/Urinary Tract: Adrenal glands are preserved. Mild right-sided renal collecting system dilatation. No right  renal stone. There is some perinephric and periureteral stranding. Along the extreme distal ureter just proximal to the UVJ is a 3 mm stone. Preserved contours of the urinary bladder. There is a nonobstructing midportion left-sided 4 mm renal stone, unchanged from previous. No left ureteral stone. 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 Stomach/Bowel: Bowel is nondilated diffusely. Scattered colonic stool normal appendix. Moderate fluid in the stomach. There is some areas of macroscopic fat along the wall of the colon, unchanged from previous. Vascular/Lymphatic: Aortic atherosclerosis. No enlarged abdominal or pelvic lymph nodes. Reproductive: Uterus and bilateral adnexa are unremarkable. Other: No abdominal wall hernia or abnormality. No abdominopelvic ascites. Musculoskeletal: Mild degenerative changes along the spine. IMPRESSION: 3 mm right distal ureteral stone just proximal to the UVJ with perinephric and periureteral stranding and slight ectasia of the collecting system. Nonobstructing left-sided renal stone. Electronically Signed   By: Karen Kays M.D.   On: 04/08/2023 18:06    Procedures Procedures    Medications Ordered in  ED Medications  potassium chloride 10 mEq in 100 mL IVPB (10 mEq Intravenous New Bag/Given 04/08/23 1825)  cefTRIAXone (ROCEPHIN) 1 g in sodium chloride 0.9 % 100 mL IVPB (has no administration in time range)  HYDROmorphone (DILAUDID) injection 1 mg (1 mg Intravenous Given 04/08/23 1613)  ondansetron (ZOFRAN) injection 4 mg (4 mg Intravenous Given 04/08/23 1613)  potassium chloride SA (KLOR-CON M) CR tablet 40 mEq (40 mEq Oral Given 04/08/23 1722)    ED Course/ Medical Decision Making/ A&P                                 Medical Decision Making Patient here with rather sudden onset of right flank pain earlier today.  Endorses some urinary hesitancy with pressure upon urinating.  No fever chills or vomiting.  History of pyelonephritis as well as  prior kidney stone.  States her current symptoms feel similar to previous pyelonephritis.  No abdominal pain at this time.  Differential would include but not limited to recurrent pyelonephritis, kidney stone, musculoskeletal pain  Patient is uncomfortable appearing on initial exam.  Will obtain labs, CT renal stone study and address her pain   Amount and/or Complexity of Data Reviewed Labs: ordered.    Details: Labs interpreted by me, no evidence of leukocytosis, hemoglobin unremarkable, hypokalemia potassium 2.7, patient not currently on diuretic but does endorse some recent bowel changes.  She also notes low potassium with previous episode of pyelonephritis.  Urine pregnancy test is negative, urinalysis shows cloudy urine with moderate leukocytes 6-10 white cells rare bacteria and 21-50 squamous cells.  Urine cultures pending Radiology: ordered.    Details: CT renal stone study shows 3 mm right distal ureteral stone just proximal to the UVJ with perinephric and periureteral stranding ECG/medicine tests: ordered.    Details: Initial EKG shows sinus rhythm with prolonged QT Discussion of management or test interpretation with external provider(s): Workup shows hypokalemia, ureteral stone on the right near the UVJ 3 mm in size, felt to be passable.  Questionable UTI, she has white cells with moderate leukocytes.  nitrate negative significant squamous cells, this is felt to be contaminant.  Culture pending.  Hypokalemia addressed here given oral and IV potassium.  Urine culture is pending, treated here this evening with IV Rocephin.  Discussed CT findings with patient.  Pain has been well-controlled here.  I feel that she is appropriate for outpatient therapy, will treat with oral antibiotics, potassium, short course of pain medication and Flomax.    Bladder scan showed 60 ml, slightly delayed post void  She will follow-up with PCP to have potassium rechecked in 1 week.  Also given follow-up  information for local urology.  She is agreeable to this plan.  Strict return precautions were also discussed  Initial EKG showed prolonged QT, repeated EKG prior to discharge, reviewed by Dr. Elayne Snare.  Risk Prescription drug management.           Final Clinical Impression(s) / ED Diagnoses Final diagnoses:  Right ureteral stone    Rx / DC Orders ED Discharge Orders     None         Pauline Aus, PA-C 04/08/23 2005    Royanne Foots, DO 04/17/23 1035

## 2023-04-08 NOTE — ED Notes (Signed)
Reviewed D/C information with the patient, pt verbalized understanding. No additional concerns at this time.

## 2023-04-08 NOTE — Discharge Instructions (Signed)
Your workup today shows that your potassium is low.  You have been given oral potassium as well as IV here.  I have written a prescription for potassium supplement to take for the next several days as well.  You will need to have your potassium rechecked next week.  Your primary care provider can arrange this for you.  Also, your CT scan shows that you have a 3 mm kidney stone on the right.  I suspect you will be able to pass this stone, but I have given follow-up information for local urology.  Please call to arrange follow-up appointment.  Take the antibiotic as directed, drink plenty of water.  Return to the emergency department for any new or worsening symptoms.

## 2023-04-08 NOTE — ED Notes (Signed)
Pt has a hx of hypertension

## 2023-04-08 NOTE — ED Triage Notes (Signed)
Pt to er, pt states that she is here for flank pain.  Pt states that the pain started about 20 minutes ago.  Pt states that she has been having some urinary urgency.  States that last this she had this pain she had pylo

## 2023-04-10 LAB — URINE CULTURE

## 2023-04-12 NOTE — Progress Notes (Signed)
   Chief Complaint: No chief complaint on file.   History of Present Illness:  [name] is a 42 y.o. female who is seen in consultation from Gilmore Laroche, FNP for evaluation of a Rt ureteral stone.   Past Medical History:  Past Medical History:  Diagnosis Date   Hypertension     Past Surgical History:  Past Surgical History:  Procedure Laterality Date   TUBAL LIGATION      Allergies:  No Known Allergies  Family History:  Family History  Problem Relation Age of Onset   Breast cancer Maternal Aunt    Breast cancer Cousin     Social History:  Social History   Tobacco Use   Smoking status: Some Days    Current packs/day: 0.25    Types: Cigarettes   Smokeless tobacco: Never   Tobacco comments:    6 a day  Vaping Use   Vaping status: Former  Substance Use Topics   Alcohol use: Yes    Alcohol/week: 5.0 standard drinks of alcohol    Types: 5 Shots of liquor per week    Comment: daily   Drug use: Yes    Types: Marijuana    Comment: occ; last use a few days ago.    Review of symptoms:  Constitutional:  Negative for unexplained weight loss, night sweats, fever, chills ENT:  Negative for nose bleeds, sinus pain, painful swallowing CV:  Negative for chest pain, shortness of breath, exercise intolerance, palpitations, loss of consciousness Resp:  Negative for cough, wheezing, shortness of breath GI:  Negative for nausea, vomiting, diarrhea, bloody stools GU:  Positives noted in HPI; otherwise negative for gross hematuria, dysuria, urinary incontinence Neuro:  Negative for seizures, poor balance, limb weakness, slurred speech Psych:  Negative for lack of energy, depression, anxiety Endocrine:  Negative for polydipsia, polyuria, symptoms of hypoglycemia (dizziness, hunger, sweating) Hematologic:  Negative for anemia, purpura, petechia, prolonged or excessive bleeding, use of anticoagulants  Allergic:  Negative for difficulty breathing or choking as a result of  exposure to anything; no shellfish allergy; no allergic response (rash/itch) to materials, foods  Physical exam: There were no vitals taken for this visit. GENERAL APPEARANCE:  Well appearing, well developed, well nourished, NAD HEENT: Atraumatic, Normocephalic, oropharynx clear. NECK: Supple without lymphadenopathy or thyromegaly. LUNGS: Clear to auscultation bilaterally. HEART: Regular Rate and Rhythm without murmurs, gallops, or rubs. ABDOMEN: Soft, non-tender, No Masses. EXTREMITIES: Moves all extremities well.  Without clubbing, cyanosis, or edema. NEUROLOGIC:  Alert and oriented x 3, normal gait, CN II-XII grossly intact.  MENTAL STATUS:  Appropriate. BACK:  Non-tender to palpation.  No CVAT SKIN:  Warm, dry and intact.    Results: No results found for this or any previous visit (from the past 24 hour(s)).  I have reviewed prior patient's records  I have reviewed referring/prior physicians records--ER notes  I have reviewed urinalysis  I have reviewed prior urine cultures  I reviewed prior imaging studies--CT images  Assessment: No diagnosis found.   Plan: ***

## 2023-04-13 ENCOUNTER — Ambulatory Visit: Payer: BC Managed Care – PPO | Admitting: Urology

## 2023-04-13 VITALS — BP 141/91 | HR 80 | Ht 66.0 in | Wt 190.0 lb

## 2023-04-13 DIAGNOSIS — N201 Calculus of ureter: Secondary | ICD-10-CM | POA: Diagnosis not present

## 2023-04-13 DIAGNOSIS — N200 Calculus of kidney: Secondary | ICD-10-CM

## 2023-04-14 LAB — URINALYSIS, ROUTINE W REFLEX MICROSCOPIC
Bilirubin, UA: NEGATIVE
Glucose, UA: NEGATIVE
Ketones, UA: NEGATIVE
Leukocytes,UA: NEGATIVE
Nitrite, UA: NEGATIVE
Protein,UA: NEGATIVE
RBC, UA: NEGATIVE
Specific Gravity, UA: 1.015 (ref 1.005–1.030)
Urobilinogen, Ur: 0.2 mg/dL (ref 0.2–1.0)
pH, UA: 7.5 (ref 5.0–7.5)

## 2023-04-22 ENCOUNTER — Other Ambulatory Visit: Payer: Self-pay | Admitting: Family Medicine

## 2023-04-22 DIAGNOSIS — I10 Essential (primary) hypertension: Secondary | ICD-10-CM

## 2023-04-23 ENCOUNTER — Other Ambulatory Visit: Payer: Self-pay

## 2023-04-23 ENCOUNTER — Emergency Department (HOSPITAL_COMMUNITY)
Admission: EM | Admit: 2023-04-23 | Discharge: 2023-04-23 | Discharge status: Against Medical Advice | Payer: BC Managed Care – PPO | Attending: Emergency Medicine | Admitting: Emergency Medicine

## 2023-04-23 ENCOUNTER — Encounter (HOSPITAL_COMMUNITY): Payer: Self-pay

## 2023-04-23 DIAGNOSIS — Z5321 Procedure and treatment not carried out due to patient leaving prior to being seen by health care provider: Secondary | ICD-10-CM | POA: Diagnosis not present

## 2023-04-23 DIAGNOSIS — N200 Calculus of kidney: Secondary | ICD-10-CM | POA: Diagnosis not present

## 2023-04-23 DIAGNOSIS — R109 Unspecified abdominal pain: Secondary | ICD-10-CM | POA: Diagnosis not present

## 2023-04-23 LAB — CBC
HCT: 40.7 % (ref 36.0–46.0)
Hemoglobin: 14.1 g/dL (ref 12.0–15.0)
MCH: 33.6 pg (ref 26.0–34.0)
MCHC: 34.6 g/dL (ref 30.0–36.0)
MCV: 96.9 fL (ref 80.0–100.0)
Platelets: 272 10*3/uL (ref 150–400)
RBC: 4.2 MIL/uL (ref 3.87–5.11)
RDW: 13.1 % (ref 11.5–15.5)
WBC: 11 10*3/uL — ABNORMAL HIGH (ref 4.0–10.5)
nRBC: 0 % (ref 0.0–0.2)

## 2023-04-23 LAB — BASIC METABOLIC PANEL
Anion gap: 12 (ref 5–15)
BUN: 10 mg/dL (ref 6–20)
CO2: 23 mmol/L (ref 22–32)
Calcium: 9.5 mg/dL (ref 8.9–10.3)
Chloride: 98 mmol/L (ref 98–111)
Creatinine, Ser: 0.67 mg/dL (ref 0.44–1.00)
GFR, Estimated: >60 mL/min (ref >60)
Glucose, Bld: 110 mg/dL — ABNORMAL HIGH (ref 70–99)
Potassium: 3.2 mmol/L — ABNORMAL LOW (ref 3.5–5.1)
Sodium: 133 mmol/L — ABNORMAL LOW (ref 135–145)

## 2023-04-23 LAB — URINALYSIS, ROUTINE W REFLEX MICROSCOPIC
Bilirubin Urine: NEGATIVE
Glucose, UA: NEGATIVE mg/dL
Ketones, ur: 20 mg/dL — AB
Nitrite: NEGATIVE
Protein, ur: 30 mg/dL — AB
RBC / HPF: >50 RBC/hpf (ref 0–5)
Specific Gravity, Urine: 1.023 (ref 1.005–1.030)
WBC, UA: >50 WBC/hpf (ref 0–5)
pH: 5 (ref 5.0–8.0)

## 2023-04-23 NOTE — ED Triage Notes (Signed)
Pt reports right side flank pain since 4pm.  Pt reports she had a kidney stone on that side over thanksgiving.

## 2023-04-27 ENCOUNTER — Encounter: Payer: Self-pay | Admitting: Family Medicine

## 2023-04-28 ENCOUNTER — Other Ambulatory Visit: Payer: Self-pay | Admitting: Family Medicine

## 2023-04-28 DIAGNOSIS — N200 Calculus of kidney: Secondary | ICD-10-CM

## 2023-04-28 MED ORDER — TAMSULOSIN HCL 0.4 MG PO CAPS: 0.4000 mg | ORAL_CAPSULE | Freq: Every day | ORAL | 0 refills | Status: DC

## 2023-05-06 ENCOUNTER — Encounter: Payer: Self-pay | Admitting: Emergency Medicine

## 2023-05-06 ENCOUNTER — Ambulatory Visit
Admission: EM | Admit: 2023-05-06 | Discharge: 2023-05-06 | Disposition: A | Discharge status: Home / Self Care | Payer: BC Managed Care – PPO | Attending: Nurse Practitioner | Admitting: Nurse Practitioner

## 2023-05-06 ENCOUNTER — Other Ambulatory Visit: Payer: Self-pay

## 2023-05-06 ENCOUNTER — Ambulatory Visit: Payer: BC Managed Care – PPO

## 2023-05-06 DIAGNOSIS — S76311A Strain of muscle, fascia and tendon of the posterior muscle group at thigh level, right thigh, initial encounter: Secondary | ICD-10-CM

## 2023-05-06 DIAGNOSIS — M25551 Pain in right hip: Secondary | ICD-10-CM | POA: Diagnosis not present

## 2023-05-06 DIAGNOSIS — M79604 Pain in right leg: Secondary | ICD-10-CM

## 2023-05-06 HISTORY — DX: Calculus of kidney: N20.0

## 2023-05-06 MED ORDER — CYCLOBENZAPRINE HCL 5 MG PO TABS: 5.0000 mg | ORAL_TABLET | Freq: Three times a day (TID) | ORAL | 0 refills | Status: DC | PRN

## 2023-05-06 MED ORDER — KETOROLAC TROMETHAMINE 30 MG/ML IJ SOLN
30.0000 mg | Freq: Once | INTRAMUSCULAR | Status: AC
Start: 2023-05-06 — End: 2023-05-06
  Administered 2023-05-06: 30 mg via INTRAMUSCULAR

## 2023-05-06 NOTE — ED Provider Notes (Signed)
[facility]    CSN: [id number] Arrival date & time: 05/06/23  1220      History   Chief Complaint Chief Complaint  Patient presents with   Hip Pain    HPI [name] is a 42 y.o. female.   The history is provided by the patient.   Patient presents for complaints of pain in the right leg that starts at the right hip and extends down the right leg.  Patient states that she slipped at home, and when she fell, she almost fell into a "split."  Patient states she has been able to bear weight, but has pain with most movement of the right lower extremity.  States that she did take an old prescription of Percocet that she had at home.  Patient with previous history of right hip pain.  Denies numbness, tingling, lower extremity weakness, or inability to bear weight.  Past Medical History:  Diagnosis Date   Hypertension    Kidney stone     Patient Active Problem List   Diagnosis Date Noted   Right hip pain 03/16/2023   Anxiety 01/18/2023   Hyperpigmentation of skin 12/11/2022   Neuropathy 11/02/2022   Panic attack as reaction to stress 10/15/2022   Bipolar 1 disorder (HCC) 10/15/2022   Cervical cancer screening 09/30/2022   Rash in adult 09/08/2022   Primary hypertension 09/08/2022   Nephrolithiasis 10/30/2019   Pyelonephritis 10/30/2019   Tobacco abuse 10/30/2019    Past Surgical History:  Procedure Laterality Date   TUBAL LIGATION      OB History   No obstetric history on file.      Home Medications    Prior to Admission medications   Medication Sig Start Date End Date Taking? Authorizing Provider  acetaminophen (TYLENOL) 500 MG tablet Take 500 mg by mouth every 6 (six) hours as needed for headache.    [provider]  cephALEXin (KEFLEX) 500 MG capsule Take 1 capsule (500 mg total) by mouth 4 (four) times daily. 04/08/23   Triplett, Tammy, PA-C  ELDERBERRY PO Take by mouth.    [provider]  olmesartan (BENICAR) 20 MG tablet  Take 1 tablet by mouth once daily 04/22/23   Gilmore Laroche, FNP  oxyCODONE-acetaminophen (PERCOCET/ROXICET) 5-325 MG tablet Take 1 tablet by mouth every 4 (four) hours as needed. 04/08/23   Triplett, Tammy, PA-C  oxyCODONE-acetaminophen (PERCOCET/ROXICET) 5-325 MG tablet Take 1 tablet by mouth every 6 (six) hours as needed for severe pain (pain score 7-10). 04/08/23   Triplett, Tammy, PA-C  potassium chloride SA (KLOR-CON M) 20 MEQ tablet Take 1 tablet (20 mEq total) by mouth 2 (two) times daily. 04/08/23   Triplett, Tammy, PA-C  tamsulosin (FLOMAX) 0.4 MG CAPS capsule Take 1 capsule (0.4 mg total) by mouth daily. 04/28/23   Gilmore Laroche, FNP  tretinoin (RETIN-A) 0.025 % cream Apply topically at bedtime. 12/07/22   Gilmore Laroche, FNP  Vitamin D, Ergocalciferol, (DRISDOL) 1.25 MG (50000 UNIT) CAPS capsule Take 1 capsule (50,000 Units total) by mouth every 7 (seven) days. 09/30/22   Gilmore Laroche, FNP    Family History Family History  Problem Relation Age of Onset   Breast cancer Maternal Aunt    Breast cancer Cousin     Social History Social History   Tobacco Use   Smoking status: Every Day    Current packs/day: 0.25    Types: Cigarettes   Smokeless tobacco: Never   Tobacco comments:    6 a day  Vaping Use   Vaping status: Former  Substance Use Topics   Alcohol use: Yes    Alcohol/week: 5.0 standard drinks of alcohol    Types: 5 Shots of liquor per week    Comment: daily   Drug use: Not Currently    Types: Marijuana    Comment: occ; last use a few days ago.     Allergies   Patient has no known allergies.   Review of Systems Review of Systems Per HPI  Physical Exam Triage Vital Signs ED Triage Vitals  Encounter Vitals Group     BP 05/06/23 1448 132/89     Systolic BP Percentile --      Diastolic BP Percentile --      Pulse Rate 05/06/23 1448 86     Resp 05/06/23 1448 17     Temp 05/06/23 1448 98.1 F (36.7 C)     Temp Source 05/06/23 1448 Oral     SpO2  05/06/23 1448 98 %     Weight --      Height --      Head Circumference --      Peak Flow --      Pain Score 05/06/23 1456 10     Pain Loc --      Pain Education --      Exclude from Growth Chart --    No data found.  Updated Vital Signs BP 132/89 (BP Location: Right Arm)   Pulse 86   Temp 98.1 F (36.7 C) (Oral)   Resp 17   LMP 04/24/2023 (Approximate)   SpO2 98%   Visual Acuity Right Eye Distance:   Left Eye Distance:   Bilateral Distance:    Right Eye Near:   Left Eye Near:    Bilateral Near:     Physical Exam Vitals and nursing note reviewed.  Constitutional:      General: She is not in acute distress.    Appearance: Normal appearance.  HENT:     Head: Normocephalic.     Nose: Nose normal.     Mouth/Throat:     Mouth: Mucous membranes are dry.  Eyes:     Extraocular Movements: Extraocular movements intact.     Pupils: Pupils are equal, round, and reactive to light.  Pulmonary:     Effort: Pulmonary effort is normal.  Musculoskeletal:     Cervical back: Normal range of motion.     Left hip: No deformity. Decreased range of motion. Decreased strength.     Left upper leg: Tenderness (posterior RUL) present. No swelling or deformity.     Left knee: Normal. No swelling or deformity. Normal range of motion. Normal pulse.     Left lower leg: Normal.     Comments: Tenderness noted to the posterior right lower leg around the hamstring muscles.   Lymphadenopathy:     Cervical: No cervical adenopathy.  Skin:    General: Skin is warm and dry.  Neurological:     General: No focal deficit present.     Mental Status: She is alert and oriented to person, place, and time.  Psychiatric:        Mood and Affect: Mood normal.        Behavior: Behavior normal.      UC Treatments / Results  Labs (all labs ordered are listed, but only abnormal results are displayed) Labs Reviewed - No data to display  EKG   Radiology No results found.  Procedures Procedures  (including  critical care time)  Medications Ordered in UC Medications  ketorolac (TORADOL) 30 MG/ML injection 30 mg (has no administration in time range)    Initial Impression / Assessment and Plan / UC Course  I have reviewed the triage vital signs and the nursing notes.  Pertinent labs & imaging results that were available during my care of the patient were reviewed by me and considered in my medical decision making (see chart for details).  X-ray of the right hip is pending.  Suspect patient may have experienced a pull of the right hamstring.  Toradol 30 mg IM was administered.  Will start patient on cyclobenzaprine 5 mg to help relax the muscle.  Supportive care recommendations were provided and discussed with the patient to include gentle range of motion exercises, the use of ice or heat, and continuing over-the-counter analgesics.  Patient was advised if symptoms have not improved over the next 1 to 2 weeks, it is recommended that she follow-up with orthopedics for further evaluation.  Patient was in agreement with this plan of care and verbalizes understanding.  All questions were answered.  Patient stable for discharge.  Work note was provided.  Final Clinical Impressions(s) / UC Diagnoses   Final diagnoses:  Hamstring muscle strain, right, initial encounter  Right hip pain  Pain in right leg     Discharge Instructions      X-ray of the right hip is pending.  You will be contacted if the pending x-ray result is abnormal.  You also have access to the results via MyChart. Take medication as prescribed. May take over-the-counter Tylenol or ibuprofen as needed for pain or discomfort. RICE therapy, rest, ice, compression, and elevation.  Recommend the use of ice or heat.  Apply ice for pain or swelling, heat for spasm or stiffness.  Apply for 20 minutes, remove for 1 hour, repeat as needed. Gentle range of motion exercises to help decrease recovery time. As discussed, if symptoms  have not improved over the next 1 to 2 weeks, it is recommended that you follow-up with orthopedics for further evaluation. Follow-up as needed.     ED Prescriptions   None    PDMP not reviewed this encounter.   Abran Cantor, NP 05/06/23 1529

## 2023-05-06 NOTE — ED Triage Notes (Signed)
Pt reports slipped and fell last night and reports right leg was extended at that time. Reports posterior right hip pain ever since. Pt able to bear weight but reports pain continues. Pt repots took pain pill prior to arrival at UC at approx noon.

## 2023-05-06 NOTE — Discharge Instructions (Addendum)
X-ray of the right hip is pending.  You will be contacted if the pending x-ray result is abnormal.  You also have access to the results via MyChart. Take medication as prescribed. May take over-the-counter Tylenol or ibuprofen as needed for pain or discomfort. RICE therapy, rest, ice, compression, and elevation.  Recommend the use of ice or heat.  Apply ice for pain or swelling, heat for spasm or stiffness.  Apply for 20 minutes, remove for 1 hour, repeat as needed. Gentle range of motion exercises to help decrease recovery time. As discussed, if symptoms have not improved over the next 1 to 2 weeks, it is recommended that you follow-up with orthopedics for further evaluation. Follow-up as needed.

## 2023-05-14 ENCOUNTER — Encounter: Payer: Self-pay | Admitting: Family Medicine

## 2023-05-14 ENCOUNTER — Ambulatory Visit (INDEPENDENT_AMBULATORY_CARE_PROVIDER_SITE_OTHER): Payer: BC Managed Care – PPO | Admitting: Family Medicine

## 2023-05-14 VITALS — BP 116/88 | HR 111 | Ht 66.0 in | Wt 189.1 lb

## 2023-05-14 DIAGNOSIS — S76311D Strain of muscle, fascia and tendon of the posterior muscle group at thigh level, right thigh, subsequent encounter: Secondary | ICD-10-CM | POA: Diagnosis not present

## 2023-05-14 MED ORDER — OXYCODONE-ACETAMINOPHEN 5-325 MG PO TABS
1.0000 | ORAL_TABLET | ORAL | 0 refills | Status: DC | PRN
Start: 2023-05-14 — End: 2023-05-26

## 2023-05-14 NOTE — Patient Instructions (Addendum)
 I appreciate the opportunity to provide care to you today!    Follow up: 6 weeks  Referrals today-  PT and orthopedic surgery   Hamstring strain treatment: Rest: Avoid activities that strain the muscle. Resting the muscle is crucial for recovery. Ice: Apply ice packs to the affected area to reduce swelling and pain, especially in the first 48 hours after the injury. Compression: Using an elastic bandage to compress the area can help minimize swelling. Elevation: Elevating the leg helps reduce swelling. Pain relief: Over-the-counter pain medications like ibuprofen  or acetaminophen  can be used to manage pain and inflammation. Stretching and strengthening: Once the acute pain subsides, gentle stretching and strengthening exercises can help improve flexibility and prevent future injuries. Physical therapy: A physical therapist can help design a personalized rehabilitation plan to restore full function of the hamstring. Avoid aggravating activities: Gradually return to activity once the muscle has healed, but avoid movements that caused the strain.    Please continue to a heart-healthy diet and increase your physical activities. Try to exercise for at least five days a week.    It was a pleasure to see you and I look forward to continuing to work together on your health and well-being. Please do not hesitate to call the office if you need care or have questions about your care.  In case of emergency, please visit the Emergency Department for urgent care, or contact our clinic at [phone] to schedule an appointment. We're here to help you!   Have a wonderful day and week. With Gratitude, Rosette Bellavance MSN, FNP-BC

## 2023-05-14 NOTE — Progress Notes (Signed)
 Established Patient Office Visit  Subjective:  Patient ID: [name], female    DOB: 02-20-1981  Age: 43 y.o. MRN: [id number]  CC:  Chief Complaint  Patient presents with   Care Management    F/u from ED pt reports hamstring muscle pull on the back of her right leg. Is unable to sit.     HPI [name] is a 43 y.o. female  presents for  ED f/u.  Hamstring Muscle Strain, Right:The patient was seen in the ED on 05/06/2023 after falling at home. She reports being able to bear weight but notes that the pain has since radiated to the right lower extremity. She denies numbness, tingling, extremity weakness, or an inability to bear weight. In the ED, she was treated with Toradol  30 mg IM and prescribed Flexeril  5 mg for muscle relaxation. Supportive care was provided, and she was advised to follow up with her PCP in 1 to 2 weeks if her symptoms did not subside. Today, the patient reports persistent right leg pain with an inability to fully bend the leg or sit. She rates her pain as 7 out of 10 and notes difficulty lying on either the right or left side due to increased pain. No numbness, tingling or extreme weakness reported.      Past Medical History:  Diagnosis Date   Hypertension    Kidney stone     Past Surgical History:  Procedure Laterality Date   TUBAL LIGATION      Family History  Problem Relation Age of Onset   Breast cancer Maternal Aunt    Breast cancer Cousin     Social History   Socioeconomic History   Marital status: Married    Spouse name: Not on file   Number of children: Not on file   Years of education: Not on file   Highest education level: Not on file  Occupational History   Not on file  Tobacco Use   Smoking status: Every Day    Current packs/day: 0.25    Types: Cigarettes   Smokeless tobacco: Never   Tobacco comments:    6 a day  Vaping Use   Vaping status: Former  Substance and Sexual Activity   Alcohol use: Yes    Alcohol/week:  5.0 standard drinks of alcohol    Types: 5 Shots of liquor per week    Comment: daily   Drug use: Not Currently    Types: Marijuana    Comment: occ; last use a few days ago.   Sexual activity: Yes    Birth control/protection: Surgical  Other Topics Concern   Not on file  Social History Narrative   Not on file   Social Drivers of Health   Financial Resource Strain: Not on file  Food Insecurity: Not on file  Transportation Needs: Not on file  Physical Activity: Not on file  Stress: Not on file  Social Connections: Not on file  Intimate Partner Violence: Not on file    Outpatient Medications Prior to Visit  Medication Sig Dispense Refill   acetaminophen  (TYLENOL ) 500 MG tablet Take 500 mg by mouth every 6 (six) hours as needed for headache.     cephALEXin  (KEFLEX ) 500 MG capsule Take 1 capsule (500 mg total) by mouth 4 (four) times daily. 28 capsule 0   cyclobenzaprine  (FLEXERIL ) 5 MG tablet Take 1 tablet (5 mg total) by mouth 3 (three) times daily as needed for muscle spasms. 30 tablet 0  ELDERBERRY PO Take by mouth.     olmesartan  (BENICAR ) 20 MG tablet Take 1 tablet by mouth once daily 90 tablet 0   oxyCODONE -acetaminophen  (PERCOCET/ROXICET) 5-325 MG tablet Take 1 tablet by mouth every 6 (six) hours as needed for severe pain (pain score 7-10). 6 tablet 0   potassium chloride  SA (KLOR-CON  M) 20 MEQ tablet Take 1 tablet (20 mEq total) by mouth 2 (two) times daily. 10 tablet 0   tamsulosin  (FLOMAX ) 0.4 MG CAPS capsule Take 1 capsule (0.4 mg total) by mouth daily. 15 capsule 0   tretinoin  (RETIN-A ) 0.025 % cream Apply topically at bedtime. 45 g 0   Vitamin D , Ergocalciferol , (DRISDOL ) 1.25 MG (50000 UNIT) CAPS capsule Take 1 capsule (50,000 Units total) by mouth every 7 (seven) days. 20 capsule 1   oxyCODONE -acetaminophen  (PERCOCET/ROXICET) 5-325 MG tablet Take 1 tablet by mouth every 4 (four) hours as needed. 8 tablet 0   No facility-administered medications prior to visit.     No Known Allergies  ROS Review of Systems  Constitutional:  Negative for chills and fever.  Eyes:  Negative for visual disturbance.  Respiratory:  Negative for chest tightness and shortness of breath.   Musculoskeletal:        Right leg pain  Neurological:  Negative for dizziness and headaches.      Objective:    Physical Exam HENT:     Head: Normocephalic.     Mouth/Throat:     Mouth: Mucous membranes are moist.  Cardiovascular:     Rate and Rhythm: Normal rate.     Heart sounds: Normal heart sounds.  Pulmonary:     Effort: Pulmonary effort is normal.     Breath sounds: Normal breath sounds.  Musculoskeletal:     Comments: 2+ popliteal, Dorsalis pedis and Tibialis Posterior pulses No visible deformity seen Decreased active ROM of the right lower extremity   Neurological:     Mental Status: She is alert.     BP 116/88   Pulse (!) 111   Ht 5' 6 (1.676 m)   Wt 189 lb 1.9 oz (85.8 kg)   LMP 04/24/2023 (Approximate)   SpO2 97%   BMI 30.52 kg/m  Wt Readings from Last 3 Encounters:  05/14/23 189 lb 1.9 oz (85.8 kg)  04/23/23 187 lb (84.8 kg)  04/13/23 190 lb (86.2 kg)    Lab Results  Component Value Date   TSH 1.700 09/08/2022   Lab Results  Component Value Date   WBC 11.0 (H) 04/23/2023   HGB 14.1 04/23/2023   HCT 40.7 04/23/2023   MCV 96.9 04/23/2023   PLT 272 04/23/2023   Lab Results  Component Value Date   NA 133 (L) 04/23/2023   K 3.2 (L) 04/23/2023   CO2 23 04/23/2023   GLUCOSE 110 (H) 04/23/2023   BUN 10 04/23/2023   CREATININE 0.67 04/23/2023   BILITOT 0.9 09/08/2022   ALKPHOS 65 09/08/2022   AST 20 09/08/2022   ALT 17 09/08/2022   PROT 7.2 09/08/2022   ALBUMIN 4.3 09/08/2022   CALCIUM 9.5 04/23/2023   ANIONGAP 12 04/23/2023   EGFR 95 01/18/2023   Lab Results  Component Value Date   CHOL 185 09/08/2022   Lab Results  Component Value Date   HDL 81 09/08/2022   Lab Results  Component Value Date   LDLCALC 87 09/08/2022    Lab Results  Component Value Date   TRIG 93 09/08/2022   Lab Results  Component Value Date  CHOLHDL 2.3 09/08/2022   Lab Results  Component Value Date   HGBA1C 4.9 09/08/2022      Assessment & Plan:  Hamstring muscle strain, right, subsequent encounter Assessment & Plan: Labs and imaging studies were reviewed. X-ray showed no acute abnormalities. A short supply of narcotics will be provided for pain relief when pain exceeds 7/10. A referral was placed to physical therapy and orthopedic surgery due to the patient's persistent pain and for collaborative care. The patient was encouraged to continue performing stretching and strengthening exercises at home as tolerated. She was also advised to follow up if experiencing sudden sharp pain at the back of the thigh during activity, an inability to bear weight on the affected leg, or a visible deformity of the lumbar or hamstring muscle.    Orders: -     Ambulatory referral to Orthopedic Surgery -     Ambulatory referral to Physical Therapy -     oxyCODONE -Acetaminophen ; Take 1 tablet by mouth every 4 (four) hours as needed for severe pain (pain score 7-10).  Dispense: 15 tablet; Refill: 0  Note: This chart has been completed using [employer] (consulting) software, and while attempts have been made to ensure accuracy, certain words and phrases may not be transcribed as intended.    Follow-up: Return in about 6 weeks (around 06/25/2023).   Laakea Pereira, FNP

## 2023-05-16 DIAGNOSIS — S76311D Strain of muscle, fascia and tendon of the posterior muscle group at thigh level, right thigh, subsequent encounter: Secondary | ICD-10-CM | POA: Insufficient documentation

## 2023-05-16 NOTE — Assessment & Plan Note (Signed)
 Labs and imaging studies were reviewed. X-ray showed no acute abnormalities. A short supply of narcotics will be provided for pain relief when pain exceeds 7/10. A referral was placed to physical therapy and orthopedic surgery due to the patient's persistent pain and for collaborative care. The patient was encouraged to continue performing stretching and strengthening exercises at home as tolerated. She was also advised to follow up if experiencing sudden sharp pain at the back of the thigh during activity, an inability to bear weight on the affected leg, or a visible deformity of the lumbar or hamstring muscle.

## 2023-05-19 ENCOUNTER — Encounter: Payer: Self-pay | Admitting: Orthopaedic Surgery

## 2023-05-19 ENCOUNTER — Ambulatory Visit: Payer: BC Managed Care – PPO | Admitting: Orthopaedic Surgery

## 2023-05-19 VITALS — BP 120/81 | HR 104 | Ht 66.0 in | Wt 182.0 lb

## 2023-05-19 DIAGNOSIS — S76311A Strain of muscle, fascia and tendon of the posterior muscle group at thigh level, right thigh, initial encounter: Secondary | ICD-10-CM | POA: Diagnosis not present

## 2023-05-19 NOTE — Progress Notes (Signed)
 Subjective:    Patient ID: [name], female    DOB: [birth year], 43 y.o.   MRN: [id number]  HPI On the day after Christmas, she slipped on something in her house and essentially did a forceful split on the floor.  She has had pain in the right hip posteriorly since then.  She went to Urgent Care and was seen.  X-rays were negative.  She cannot sit down because the right buttock is so tender.  She is better now, about two weeks later but still has pain.  She works a production designer, theatre/television/film at [employer] and has not been to work.  She stands or lays down.  She cannot sit long at all.  She has no numbness.   Review of Systems  Constitutional:  Positive for activity change.  Musculoskeletal:  Positive for arthralgias and myalgias.  All other systems reviewed and are negative. For Review of Systems, all other systems reviewed and are negative.  The following is a summary of the past history medically, past history surgically, known current medicines, social history and family history.  This information is gathered electronically by the computer from prior information and documentation.  I review this each visit and have found including this information at this point in the chart is beneficial and informative.   Past Medical History:  Diagnosis Date   Hypertension    Kidney stone     Past Surgical History:  Procedure Laterality Date   TUBAL LIGATION      Current Outpatient Medications on File Prior to Visit  Medication Sig Dispense Refill   acetaminophen  (TYLENOL ) 500 MG tablet Take 500 mg by mouth every 6 (six) hours as needed for headache.     cyclobenzaprine  (FLEXERIL ) 5 MG tablet Take 1 tablet (5 mg total) by mouth 3 (three) times daily as needed for muscle spasms. 30 tablet 0   ELDERBERRY PO Take by mouth.     olmesartan  (BENICAR ) 20 MG tablet Take 1 tablet by mouth once daily 90 tablet 0   oxyCODONE -acetaminophen  (PERCOCET/ROXICET) 5-325 MG tablet Take 1 tablet by mouth every 6 (six) hours as  needed for severe pain (pain score 7-10). 6 tablet 0   tretinoin  (RETIN-A ) 0.025 % cream Apply topically at bedtime. 45 g 0   Vitamin D , Ergocalciferol , (DRISDOL ) 1.25 MG (50000 UNIT) CAPS capsule Take 1 capsule (50,000 Units total) by mouth every 7 (seven) days. 20 capsule 1   cephALEXin  (KEFLEX ) 500 MG capsule Take 1 capsule (500 mg total) by mouth 4 (four) times daily. (Patient not taking: Reported on 05/19/2023) 28 capsule 0   oxyCODONE -acetaminophen  (PERCOCET/ROXICET) 5-325 MG tablet Take 1 tablet by mouth every 4 (four) hours as needed for severe pain (pain score 7-10). (Patient not taking: Reported on 05/19/2023) 15 tablet 0   potassium chloride  SA (KLOR-CON  M) 20 MEQ tablet Take 1 tablet (20 mEq total) by mouth 2 (two) times daily. (Patient not taking: Reported on 05/19/2023) 10 tablet 0   tamsulosin  (FLOMAX ) 0.4 MG CAPS capsule Take 1 capsule (0.4 mg total) by mouth daily. (Patient not taking: Reported on 05/19/2023) 15 capsule 0   No current facility-administered medications on file prior to visit.    Social History   Socioeconomic History   Marital status: Married    Spouse name: Not on file   Number of children: Not on file   Years of education: Not on file   Highest education level: Not on file  Occupational History   Not on file  Tobacco Use   Smoking status: Every Day    Current packs/day: 0.25    Types: Cigarettes   Smokeless tobacco: Never   Tobacco comments:    6 a day  Vaping Use   Vaping status: Former  Substance and Sexual Activity   Alcohol use: Yes    Alcohol/week: 5.0 standard drinks of alcohol    Types: 5 Shots of liquor per week    Comment: daily   Drug use: Not Currently    Types: Marijuana    Comment: occ; last use a few days ago.   Sexual activity: Yes    Birth control/protection: Surgical  Other Topics Concern   Not on file  Social History Narrative   Not on file   Social Drivers of Health   Financial Resource Strain: Not on file  Food Insecurity:  Not on file  Transportation Needs: Not on file  Physical Activity: Not on file  Stress: Not on file  Social Connections: Not on file  Intimate Partner Violence: Not on file    Family History  Problem Relation Age of Onset   Breast cancer Maternal Aunt    Breast cancer Cousin     BP 120/81   Pulse (!) 104   Ht 5' 6 (1.676 m)   Wt 182 lb (82.6 kg)   LMP 04/24/2023 (Approximate)   BMI 29.38 kg/m   Body mass index is 29.38 kg/m.      Objective:   Physical Exam Vitals and nursing note reviewed. Exam conducted with a chaperone present.  Constitutional:      Appearance: She is well-developed.  HENT:     Head: Normocephalic and atraumatic.  Eyes:     Conjunctiva/sclera: Conjunctivae normal.     Pupils: Pupils are equal, round, and reactive to light.  Cardiovascular:     Rate and Rhythm: Normal rate and regular rhythm.  Pulmonary:     Effort: Pulmonary effort is normal.  Abdominal:     Palpations: Abdomen is soft.  Musculoskeletal:     Cervical back: Normal range of motion and neck supple.       Legs:  Skin:    General: Skin is warm and dry.  Neurological:     Mental Status: She is alert and oriented to person, place, and time.     Cranial Nerves: No cranial nerve deficit.     Motor: No abnormal muscle tone.     Coordination: Coordination normal.     Deep Tendon Reflexes: Reflexes are normal and symmetric. Reflexes normal.  Psychiatric:        Behavior: Behavior normal.        Thought Content: Thought content normal.        Judgment: Judgment normal.   I have independently reviewed and interpreted x-rays of this patient done at another site by another physician or qualified health professional. I have reviewed the Urgent Care notes.        Assessment & Plan:   Encounter Diagnosis  Name Primary?   Hamstring muscle strain, right, initial encounter Yes   I have explained what happened and showed her the skeleton and showed were the hamstrings originate.   She did a split and pulled or partially tore the hamstrings here.  She is improving and I will hold off on MRI.  She needs to rest, give it time.  She has pain medicine.  I will see in one week.  Stay out of work.  Use ice and if using heat, no  more than 20 to 30 minutes at a time.  Call if any problem.  Precautions discussed.  Electronically Signed Lemond Stable, MD 1/8/202512:02 PM

## 2023-05-24 NOTE — Therapy (Signed)
 OUTPATIENT PHYSICAL THERAPY LOWER EXTREMITY EVALUATION   Patient Name: [name] MRN: [id number] DOB:28-Nov-1980, 43 y.o., female Today's Date: 05/25/2023  END OF SESSION:  PT End of Session - 05/25/23 1440     Visit Number 1    Number of Visits 8    Date for PT Re-Evaluation 06/22/23    Authorization Type BCBS; please check auth    PT Start Time 1435    PT Stop Time 1515    PT Time Calculation (min) 40 min    Activity Tolerance Patient tolerated treatment well    Behavior During Therapy [facility] for tasks assessed/performed             Past Medical History:  Diagnosis Date   Hypertension    Kidney stone    Past Surgical History:  Procedure Laterality Date   TUBAL LIGATION     Patient Active Problem List   Diagnosis Date Noted   Hamstring muscle strain, right, subsequent encounter 05/16/2023   Right hip pain 03/16/2023   Anxiety 01/18/2023   Hyperpigmentation of skin 12/11/2022   Neuropathy 11/02/2022   Panic attack as reaction to stress 10/15/2022   Bipolar 1 disorder (HCC) 10/15/2022   Cervical cancer screening 09/30/2022   Rash in adult 09/08/2022   Primary hypertension 09/08/2022   Nephrolithiasis 10/30/2019   Pyelonephritis 10/30/2019   Tobacco abuse 10/30/2019    PCP: Zarwolo, Gloria  REFERRING PROVIDER: Zarwolo, Gloria, FNP  REFERRING DIAG: [phone] (ICD-10-CM) - Hamstring muscle strain, right, subsequent encounter  THERAPY DIAG:  Pain in right hip  Strain of right hamstring, initial encounter  Rationale for Evaluation and Treatment: Rehabilitation  ONSET DATE: 05/05/2023  SUBJECTIVE:   SUBJECTIVE STATEMENT: Pulled her hamstring in a fall Christmas night; tripped over her husbands feet; did almost a full split with right foot out front.  Initially could not sit or walk ; could not pick leg up.  No bruising reported. Tried to walk yesterday and paying for it today.  Here back in November for right hip; could not keep her follow up appts due  to work schedule; production designer, theatre/television/film at [employer].  Currently has her out of work.  Sees Dr. Brenna Thursday morning.    PERTINENT HISTORY: Works as a social research officer, government at [employer] PAIN:  Are you having pain? Yes: NPRS scale: 3 Pain location: right hamstring Pain description: tight and sore Aggravating factors: walking too much, sitting too much Relieving factors: laying with leg propped  PRECAUTIONS: None     WEIGHT BEARING RESTRICTIONS: No  FALLS:  Has patient fallen in last 6 months? Yes. Number of falls 1  OCCUPATION: manager at [employer]  PLOF: Independent  PATIENT GOALS: to be 100%  NEXT MD VISIT: sees Brenna on Thursday 1/16  OBJECTIVE:  Note: Objective measures were completed at Evaluation unless otherwise noted.  DIAGNOSTIC FINDINGS:  CLINICAL DATA:  Recent fall with right hip pain, initial encounter   EXAM: DG HIP (WITH OR WITHOUT PELVIS) 2v RIGHT   COMPARISON:  03/16/2023   FINDINGS: Pelvic ring is intact. No acute fracture or dislocation is noted. No soft tissue abnormality is seen.   IMPRESSION: No acute abnormality noted.  PATIENT SURVEYS:  LEFS 37/80 46.3%  COGNITION: Overall cognitive status: Within functional limits for tasks assessed     SENSATION: WFL  EDEMA:  Not currently but did have swelling initially  MUSCLE LENGTH: Hamstrings: Right 38 deg; Left 70 deg AAROM  POSTURE: No Significant postural limitations  PALPATION: Tender origin and  insertion areas  LOWER EXTREMITY ROM:  Active ROM Right eval Left eval  Hip flexion    Hip extension    Hip abduction    Hip adduction    Hip internal rotation    Hip external rotation    Knee flexion 110 122  Knee extension    Ankle dorsiflexion    Ankle plantarflexion    Ankle inversion    Ankle eversion     (Blank rows = not tested)  LOWER EXTREMITY MMT:  MMT Right eval Left eval  Hip flexion    Hip extension    Hip abduction    Hip adduction    Hip internal rotation    Hip  external rotation    Knee flexion 3- 4+  Knee extension    Ankle dorsiflexion    Ankle plantarflexion    Ankle inversion    Ankle eversion     (Blank rows = not tested)  FUNCTIONAL TESTS:  5 times sit to stand: next visit  GAIT: Distance walked: 80 ft in clinic Assistive device utilized: None Level of assistance: Modified independence Comments: decreased stance and stride length right lower extremity; slight antalgic gait; decreased gait speed                                                                                                                                TREATMENT DATE: 05/25/23 physical therapy evaluation and HEP instruction    PATIENT EDUCATION:  Education details: Patient educated on exam findings, POC, scope of PT, HEP, and what to expect next visit. Person educated: Patient Education method: Explanation, Demonstration, and Handouts Education comprehension: verbalized understanding, returned demonstration, verbal cues required, and tactile cues required  HOME EXERCISE PROGRAM: Access Code: [facility] URL: [email or web address] Date: 05/25/2023 Prepared by: AP - Rehab  Exercises - Seated Hamstring Stretch  - 2 x daily - 7 x weekly - 1 sets - 5 reps - 20 sec hold - Seated Table Hamstring Stretch  - 2 x daily - 7 x weekly - 1 sets - 5 reps - 20 sec hold - Supine Hamstring Stretch  - 2 x daily - 7 x weekly - 1 sets - 5 reps - 20 sec hold  ASSESSMENT:  CLINICAL IMPRESSION: Patient is a 43 y.o. female who was seen today for physical therapy evaluation and treatment for right hamstring strain.  Patient demonstrates muscle weakness, reduced ROM, and fascial restrictions which are likely contributing to symptoms of pain and are negatively impacting patient ability to perform ADLs and functional mobility tasks. Patient will benefit from skilled physical therapy services to address these deficits to reduce pain and improve level of function with ADLs and  functional mobility tasks.   OBJECTIVE IMPAIRMENTS: Abnormal gait, decreased activity tolerance, difficulty walking, decreased ROM, decreased strength, hypomobility, increased fascial restrictions, and impaired perceived functional ability.   ACTIVITY LIMITATIONS: lifting, bending, sitting, standing, squatting, sleeping, stairs, and locomotion level  PARTICIPATION LIMITATIONS: cleaning,  laundry, driving, and occupation  REHAB POTENTIAL: Good  CLINICAL DECISION MAKING: Stable/uncomplicated  EVALUATION COMPLEXITY: Low   GOALS: Goals reviewed with patient? No  SHORT TERM GOALS: Target date: 06/08/2023 patient will be independent with initial HEP Baseline: Goal status: INITIAL  2.  Patient will self report 50% improvement to improve tolerance for functional activity  Baseline:  Goal status: INITIAL    LONG TERM GOALS: Target date: 06/22/2023  Patient will be independent in self management strategies to improve quality of life and functional outcomes.  Baseline:  Goal status: INITIAL  2.  Patient will self report 75% improvement to improve tolerance for functional activity  Baseline:  Goal status: INITIAL  3.  Patient will increase right SLR to equal to left SLR Baseline:  Goal status: INITIAL  4.  Patient will improve LEFS score by 10 points or more to demonstrate improve perceived functional mobility Baseline: 37/80 Goal status: INITIAL  5.  Patient will ambulate x 15 minutes without leg pain to prepare for return to work activities Baseline:  Goal status: INITIAL    PLAN:  PT FREQUENCY: 2x/week  PT DURATION: 4 weeks  PLANNED INTERVENTIONS: 97164- PT Re-evaluation, 97110-Therapeutic exercises, 97530- Therapeutic activity, 97112- Neuromuscular re-education, 97535- Self Care, 02859- Manual therapy, [phone]- Gait training, [phone]- Orthotic Fit/training, [phone]- Canalith repositioning, [id number]- Aquatic Therapy, [phone]- Splinting, Patient/Family education, Balance  training, Stair training, Taping, Dry Needling, Joint mobilization, Joint manipulation, Spinal manipulation, Spinal mobilization, Scar mobilization, and DME instructions.   PLAN FOR NEXT SESSION: Review HEP and goals; begin with mobility; hamstring stretching and then initiate strengthening; bike if can tolerate    BCBS Authorization Request  Visit Dx Codes: F74.448, D23.688J  Functional Tool Score: LEFS 37/80  For all possible CPT codes, reference the Planned Interventions line above.     Check all conditions that are expected to impact treatment: {Conditions expected to impact treatment:Unknown   If treatment provided at initial evaluation, no treatment charged due to lack of authorization.      3:14 PM, 05/25/23 Jaicion Laurie Small Hallie Ertl MPT [facility] [place] [phone]

## 2023-05-25 ENCOUNTER — Ambulatory Visit (HOSPITAL_COMMUNITY): Payer: BC Managed Care – PPO | Attending: Family Medicine

## 2023-05-25 ENCOUNTER — Other Ambulatory Visit: Payer: Self-pay

## 2023-05-25 DIAGNOSIS — S76311D Strain of muscle, fascia and tendon of the posterior muscle group at thigh level, right thigh, subsequent encounter: Secondary | ICD-10-CM | POA: Diagnosis not present

## 2023-05-25 DIAGNOSIS — M25551 Pain in right hip: Secondary | ICD-10-CM | POA: Insufficient documentation

## 2023-05-25 DIAGNOSIS — S76311A Strain of muscle, fascia and tendon of the posterior muscle group at thigh level, right thigh, initial encounter: Secondary | ICD-10-CM | POA: Insufficient documentation

## 2023-05-26 ENCOUNTER — Telehealth: Payer: Self-pay | Admitting: Orthopaedic Surgery

## 2023-05-26 MED ORDER — OXYCODONE-ACETAMINOPHEN 5-325 MG PO TABS: 1.0000 | ORAL_TABLET | Freq: Four times a day (QID) | ORAL | 0 refills | Status: AC | PRN

## 2023-05-26 NOTE — Telephone Encounter (Signed)
 Dr. Vicente Graham pt - spoke w/the patient, she stated Dr. Iline Mallory has not given her any pain meds, but told her when she ran out from what [facility] prescribed that he would prescribe more.  She is requesting a refill be sent to [facility] in [place].  She also has an appointment with Dr. Linnell Richardson tomorrow.

## 2023-05-26 NOTE — Telephone Encounter (Signed)
 Dr. Vicente Graham pt - pt lvm on 05/25/23 at 4:44pm requesting a refill.  She also stated she wanted to schedule her mom an appointment, but she didn't leave any information regarding her mom.  I tried to call the pt back, not able to lvm.

## 2023-05-27 ENCOUNTER — Encounter: Payer: Self-pay | Admitting: Orthopaedic Surgery

## 2023-05-27 ENCOUNTER — Ambulatory Visit: Payer: BC Managed Care – PPO | Admitting: Orthopaedic Surgery

## 2023-05-27 VITALS — BP 144/95 | HR 91 | Ht 66.0 in | Wt 189.5 lb

## 2023-05-27 DIAGNOSIS — S76311D Strain of muscle, fascia and tendon of the posterior muscle group at thigh level, right thigh, subsequent encounter: Secondary | ICD-10-CM

## 2023-05-27 NOTE — Progress Notes (Signed)
I still hurt but I am better.  She has less pain of the right hamstring origin.  She can sit a little longer now but still cannot walk more than a 100 feet or so without pain.  She has no new trauma.    ROM of the right hip is good but tender over the posterior hip and buttocks area.  NV intact.  Encounter Diagnosis  Name Primary?   Hamstring muscle strain, right, subsequent encounter Yes   I refilled her medicine yesterday.  She has been to PT and do the exercises they showed her.  Stay out of work.  Return in one week.  Call if any problem.  Precautions discussed.  Electronically Signed Darreld Mclean, MD 1/16/20258:39 AM

## 2023-05-27 NOTE — Patient Instructions (Signed)
Out of work for another week.

## 2023-05-28 ENCOUNTER — Encounter (HOSPITAL_COMMUNITY): Payer: Self-pay

## 2023-05-28 ENCOUNTER — Ambulatory Visit (HOSPITAL_COMMUNITY): Payer: BC Managed Care – PPO

## 2023-05-28 DIAGNOSIS — S76311A Strain of muscle, fascia and tendon of the posterior muscle group at thigh level, right thigh, initial encounter: Secondary | ICD-10-CM | POA: Diagnosis not present

## 2023-05-28 DIAGNOSIS — M25551 Pain in right hip: Secondary | ICD-10-CM | POA: Diagnosis not present

## 2023-05-28 DIAGNOSIS — S76311D Strain of muscle, fascia and tendon of the posterior muscle group at thigh level, right thigh, subsequent encounter: Secondary | ICD-10-CM | POA: Diagnosis not present

## 2023-05-28 NOTE — Therapy (Signed)
OUTPATIENT PHYSICAL THERAPY LOWER EXTREMITY TREATMENT   Patient Name: [name] MRN: [id number] DOB:1980-12-25, 43 y.o., female Today's Date: 05/28/2023  END OF SESSION:  PT End of Session - 05/28/23 1234     Visit Number 2    Number of Visits 8    Date for PT Re-Evaluation 06/22/23    Authorization Type BCBS    Authorization Time Period no auth required    Progress Note Due on Visit 8    PT Start Time 1153    PT Stop Time 1233    PT Time Calculation (min) 40 min    Activity Tolerance Patient tolerated treatment well    Behavior During Therapy [facility] for tasks assessed/performed              Past Medical History:  Diagnosis Date   Hypertension    Kidney stone    Past Surgical History:  Procedure Laterality Date   TUBAL LIGATION     Patient Active Problem List   Diagnosis Date Noted   Hamstring muscle strain, right, subsequent encounter 05/16/2023   Right hip pain 03/16/2023   Anxiety 01/18/2023   Hyperpigmentation of skin 12/11/2022   Neuropathy 11/02/2022   Panic attack as reaction to stress 10/15/2022   Bipolar 1 disorder (HCC) 10/15/2022   Cervical cancer screening 09/30/2022   Rash in adult 09/08/2022   Primary hypertension 09/08/2022   Nephrolithiasis 10/30/2019   Pyelonephritis 10/30/2019   Tobacco abuse 10/30/2019    PCP: Gilmore Laroche  REFERRING PROVIDER: Gilmore Laroche, FNP  REFERRING DIAG: [phone] (ICD-10-CM) - Hamstring muscle strain, right, subsequent encounter  THERAPY DIAG:  Pain in right hip  Strain of right hamstring, initial encounter  Rationale for Evaluation and Treatment: Rehabilitation  ONSET DATE: 05/05/2023  SUBJECTIVE:   SUBJECTIVE STATEMENT: 2-3/10 pain today and no questions regarding HEP, reports they are easy. Pt arriving 6 minutes late  PERTINENT HISTORY: Works as a Social research officer, government at [employer] PAIN:  Are you having pain? Yes: NPRS scale: 3 Pain location: right hamstring Pain description: tight and  sore Aggravating factors: walking too much, sitting too much Relieving factors: laying with leg propped  PRECAUTIONS: None     WEIGHT BEARING RESTRICTIONS: No  FALLS:  Has patient fallen in last 6 months? Yes. Number of falls 1  OCCUPATION: manager at [employer]  PLOF: Independent  PATIENT GOALS: to be 100%  NEXT MD VISIT: sees Hilda Lias on Thursday 1/16  OBJECTIVE:  Note: Objective measures were completed at Evaluation unless otherwise noted.  DIAGNOSTIC FINDINGS:  CLINICAL DATA:  Recent fall with right hip pain, initial encounter   EXAM: DG HIP (WITH OR WITHOUT PELVIS) 2v RIGHT   COMPARISON:  03/16/2023   FINDINGS: Pelvic ring is intact. No acute fracture or dislocation is noted. No soft tissue abnormality is seen.   IMPRESSION: No acute abnormality noted.  PATIENT SURVEYS:  LEFS 37/80 46.3%  COGNITION: Overall cognitive status: Within functional limits for tasks assessed     SENSATION: WFL  EDEMA:  Not currently but did have swelling initially  MUSCLE LENGTH: Hamstrings: Right 38 deg; Left 70 deg AAROM  POSTURE: No Significant postural limitations  PALPATION: Tender origin and insertion areas  LOWER EXTREMITY ROM:  Active ROM Right eval Left eval  Hip flexion    Hip extension    Hip abduction    Hip adduction    Hip internal rotation    Hip external rotation    Knee flexion 110 122  Knee extension  Ankle dorsiflexion    Ankle plantarflexion    Ankle inversion    Ankle eversion     (Blank rows = not tested)  LOWER EXTREMITY MMT:  MMT Right eval Left eval  Hip flexion    Hip extension    Hip abduction    Hip adduction    Hip internal rotation    Hip external rotation    Knee flexion 3- 4+  Knee extension    Ankle dorsiflexion    Ankle plantarflexion    Ankle inversion    Ankle eversion     (Blank rows = not tested)  FUNCTIONAL TESTS:  5 times sit to stand: next visit  GAIT: Distance walked: 80 ft in clinic Assistive  device utilized: None Level of assistance: Modified independence Comments: decreased stance and stride length right lower extremity; slight antalgic gait; decreased gait speed                                                                                                                                TREATMENT DATE:  05/28/2023  -Seated HS stretch 3 x 30'' -Prone STM with light theragun and manual pressure at distal hamstring and proximal lateral hip musculature x 8' -Prone knee flexion x 20 with 5'' eccentric control  -Knee bent prone hip extension x 10 w/ 5' -Clamshells 20x w/ 5'' iso -Supine bridge 20x 5'' iso  05/25/23 physical therapy evaluation and HEP instruction    PATIENT EDUCATION:  Education details: Patient educated on exam findings, POC, scope of PT, HEP, and what to expect next visit. Person educated: Patient Education method: Explanation, Demonstration, and Handouts Education comprehension: verbalized understanding, returned demonstration, verbal cues required, and tactile cues required  HOME EXERCISE PROGRAM: Access Code: [facility] URL: [email or web address] Date: 05/25/2023 Prepared by: AP - Rehab  Exercises - Seated Hamstring Stretch  - 2 x daily - 7 x weekly - 1 sets - 5 reps - 20 sec hold - Seated Table Hamstring Stretch  - 2 x daily - 7 x weekly - 1 sets - 5 reps - 20 sec hold - Supine Hamstring Stretch  - 2 x daily - 7 x weekly - 1 sets - 5 reps - 20 sec hold Access Code: [email or web address] URL: [email or web address] Date: 05/28/2023 Prepared by: Starling Manns  Exercises - Prone Knee Flexion AROM  - 1 x daily - 7 x weekly - 3 sets - 10 reps - Prone Hip Extension with Bent Knee  - 1 x daily - 7 x weekly - 3 sets - 10 reps - Clamshell  - 1 x daily - 7 x weekly - 3 sets - 10 reps - Supine Bridge  - 1 x daily - 7 x weekly - 3 sets - 10 reps ASSESSMENT:  CLINICAL IMPRESSION: Pt tolerating session well. Introduced low level isometrics  through the hamstring and gluteus muscles to tolerance to promote pain modulation in proximal hamstring. Introducing cross sectional gentle STM to  distal hamstring prior to strengthening to ease transition of muscle activation. Tolerated activities well. HEP updated. Pt will benefit from skilled Physical Therapy services to address deficits/limitations in order to improve functional and QOL.    OBJECTIVE IMPAIRMENTS: Abnormal gait, decreased activity tolerance, difficulty walking, decreased ROM, decreased strength, hypomobility, increased fascial restrictions, and impaired perceived functional ability.   ACTIVITY LIMITATIONS: lifting, bending, sitting, standing, squatting, sleeping, stairs, and locomotion level  PARTICIPATION LIMITATIONS: cleaning, laundry, driving, and occupation  REHAB POTENTIAL: Good  CLINICAL DECISION MAKING: Stable/uncomplicated  EVALUATION COMPLEXITY: Low   GOALS: Goals reviewed with patient? No  SHORT TERM GOALS: Target date: 06/08/2023 patient will be independent with initial HEP Baseline: Goal status: INITIAL  2.  Patient will self report 50% improvement to improve tolerance for functional activity  Baseline:  Goal status: INITIAL    LONG TERM GOALS: Target date: 06/22/2023  Patient will be independent in self management strategies to improve quality of life and functional outcomes.  Baseline:  Goal status: INITIAL  2.  Patient will self report 75% improvement to improve tolerance for functional activity  Baseline:  Goal status: INITIAL  3.  Patient will increase right SLR to equal to left SLR Baseline:  Goal status: INITIAL  4.  Patient will improve LEFS score by 10 points or more to demonstrate improve perceived functional mobility Baseline: 37/80 Goal status: INITIAL  5.  Patient will ambulate x 15 minutes without leg pain to prepare for return to work activities Baseline:  Goal status: INITIAL    PLAN:  PT FREQUENCY:  2x/week  PT DURATION: 4 weeks  PLANNED INTERVENTIONS: 97164- PT Re-evaluation, 97110-Therapeutic exercises, 97530- Therapeutic activity, 97112- Neuromuscular re-education, 97535- Self Care, 40981- Manual therapy, [phone]- Gait training, [phone]- Orthotic Fit/training, [phone]- Canalith repositioning, [id number]- Aquatic Therapy, [phone]- Splinting, Patient/Family education, Balance training, Stair training, Taping, Dry Needling, Joint mobilization, Joint manipulation, Spinal manipulation, Spinal mobilization, Scar mobilization, and DME instructions.   PLAN FOR NEXT SESSION: Review HEP and goals; begin with mobility; hamstring stretching and then initiate strengthening; bike if can tolerate    12:35 PM, 05/28/23 Nelida Meuse PT, DPT [facility] [place] [address] [phone] office

## 2023-06-01 ENCOUNTER — Ambulatory Visit (HOSPITAL_COMMUNITY): Payer: BC Managed Care – PPO

## 2023-06-01 DIAGNOSIS — S76311A Strain of muscle, fascia and tendon of the posterior muscle group at thigh level, right thigh, initial encounter: Secondary | ICD-10-CM | POA: Diagnosis not present

## 2023-06-01 DIAGNOSIS — M25551 Pain in right hip: Secondary | ICD-10-CM

## 2023-06-01 DIAGNOSIS — S76311D Strain of muscle, fascia and tendon of the posterior muscle group at thigh level, right thigh, subsequent encounter: Secondary | ICD-10-CM | POA: Diagnosis not present

## 2023-06-01 NOTE — Therapy (Signed)
OUTPATIENT PHYSICAL THERAPY LOWER EXTREMITY TREATMENT   Patient Name: [name] MRN: [id number] DOB:1980-08-04, 43 y.o., female Today's Date: 06/01/2023  END OF SESSION:  PT End of Session - 06/01/23 1021     Visit Number 3    Number of Visits 8    Date for PT Re-Evaluation 06/22/23    Authorization Type BCBS    Authorization Time Period no auth required    Progress Note Due on Visit 8    PT Start Time 1020    PT Stop Time 1100    PT Time Calculation (min) 40 min    Activity Tolerance Patient tolerated treatment well    Behavior During Therapy [facility] for tasks assessed/performed              Past Medical History:  Diagnosis Date   Hypertension    Kidney stone    Past Surgical History:  Procedure Laterality Date   TUBAL LIGATION     Patient Active Problem List   Diagnosis Date Noted   Hamstring muscle strain, right, subsequent encounter 05/16/2023   Right hip pain 03/16/2023   Anxiety 01/18/2023   Hyperpigmentation of skin 12/11/2022   Neuropathy 11/02/2022   Panic attack as reaction to stress 10/15/2022   Bipolar 1 disorder (HCC) 10/15/2022   Cervical cancer screening 09/30/2022   Rash in adult 09/08/2022   Primary hypertension 09/08/2022   Nephrolithiasis 10/30/2019   Pyelonephritis 10/30/2019   Tobacco abuse 10/30/2019    PCP: Gilmore Laroche  REFERRING PROVIDER: Gilmore Laroche, FNP  REFERRING DIAG: [phone] (ICD-10-CM) - Hamstring muscle strain, right, subsequent encounter  THERAPY DIAG:  Pain in right hip  Strain of right hamstring, initial encounter  Rationale for Evaluation and Treatment: Rehabilitation  ONSET DATE: 05/05/2023  SUBJECTIVE:   SUBJECTIVE STATEMENT: Hamstring is much better than it was but it has been sore and tender  PERTINENT HISTORY: Works as a Social research officer, government at [employer] PAIN:  Are you having pain? Yes: NPRS scale: 3 Pain location: right hamstring Pain description: tight and sore Aggravating factors: walking  too much, sitting too much Relieving factors: laying with leg propped  PRECAUTIONS: None     WEIGHT BEARING RESTRICTIONS: No  FALLS:  Has patient fallen in last 6 months? Yes. Number of falls 1  OCCUPATION: manager at [employer]  PLOF: Independent  PATIENT GOALS: to be 100%  NEXT MD VISIT: sees Hilda Lias on Thursday 1/16  OBJECTIVE:  Note: Objective measures were completed at Evaluation unless otherwise noted.  DIAGNOSTIC FINDINGS:  CLINICAL DATA:  Recent fall with right hip pain, initial encounter   EXAM: DG HIP (WITH OR WITHOUT PELVIS) 2v RIGHT   COMPARISON:  03/16/2023   FINDINGS: Pelvic ring is intact. No acute fracture or dislocation is noted. No soft tissue abnormality is seen.   IMPRESSION: No acute abnormality noted.  PATIENT SURVEYS:  LEFS 37/80 46.3%  COGNITION: Overall cognitive status: Within functional limits for tasks assessed     SENSATION: WFL  EDEMA:  Not currently but did have swelling initially  MUSCLE LENGTH: Hamstrings: Right 38 deg; Left 70 deg AAROM  POSTURE: No Significant postural limitations  PALPATION: Tender origin and insertion areas  LOWER EXTREMITY ROM:  Active ROM Right eval Left eval  Hip flexion    Hip extension    Hip abduction    Hip adduction    Hip internal rotation    Hip external rotation    Knee flexion 110 122  Knee extension    Ankle  dorsiflexion    Ankle plantarflexion    Ankle inversion    Ankle eversion     (Blank rows = not tested)  LOWER EXTREMITY MMT:  MMT Right eval Left eval  Hip flexion    Hip extension    Hip abduction    Hip adduction    Hip internal rotation    Hip external rotation    Knee flexion 3- 4+  Knee extension    Ankle dorsiflexion    Ankle plantarflexion    Ankle inversion    Ankle eversion     (Blank rows = not tested)  FUNCTIONAL TESTS:  5 times sit to stand: next visit  GAIT: Distance walked: 80 ft in clinic Assistive device utilized: None Level of  assistance: Modified independence Comments: decreased stance and stride length right lower extremity; slight antalgic gait; decreased gait speed                                                                                                                                TREATMENT DATE:  06/01/23 Supine: moist heat to the right hamstring to decrease soreness and increase soft tissue extensibility x 7' Active right hamstring stretch with towel 5 x 10" each Nustep seat 7 or 8 x 5' for mobility Standing: Calf stretch against the wall gastroc then soleus 5 x 10" each Hamstring stretch on step 7";  5 x 10" Updated HEP   05/28/2023  -Seated HS stretch 3 x 30'' -Prone STM with light theragun and manual pressure at distal hamstring and proximal lateral hip musculature x 8' -Prone knee flexion x 20 with 5'' eccentric control  -Knee bent prone hip extension x 10 w/ 5' -Clamshells 20x w/ 5'' iso -Supine bridge 20x 5'' iso  05/25/23 physical therapy evaluation and HEP instruction    PATIENT EDUCATION:  Education details: Patient educated on exam findings, POC, scope of PT, HEP, and what to expect next visit. Person educated: Patient Education method: Explanation, Demonstration, and Handouts Education comprehension: verbalized understanding, returned demonstration, verbal cues required, and tactile cues required  HOME EXERCISE PROGRAM: 06/01/23 calf stretch, soleus stretch, standing hamstring stretch  Access Code: [email or web address] URL: [email or web address] Date: 05/25/2023 Prepared by: AP - Rehab  Exercises - Seated Hamstring Stretch  - 2 x daily - 7 x weekly - 1 sets - 5 reps - 20 sec hold - Seated Table Hamstring Stretch  - 2 x daily - 7 x weekly - 1 sets - 5 reps - 20 sec hold - Supine Hamstring Stretch  - 2 x daily - 7 x weekly - 1 sets - 5 reps - 20 sec hold Access Code: [email or web address] URL: [email or web address] Date: 05/28/2023 Prepared by: Starling Manns  Exercises - Prone Knee Flexion AROM  - 1 x daily - 7 x weekly - 3 sets - 10 reps - Prone Hip Extension with Bent Knee  - 1 x daily - 7 x weekly - 3 sets -  10 reps - Clamshell  - 1 x daily - 7 x weekly - 3 sets - 10 reps - Supine Bridge  - 1 x daily - 7 x weekly - 3 sets - 10 reps ASSESSMENT:  CLINICAL IMPRESSION: Soreness today since last treatment so started with moist heat to decrease soreness and increase soft tissue extensibility. Reports some calf tightness with hamstring stretching so added standing calf stretch today. Continued work on hamstring mobility, added Nustep today for mobility.  Of note, patient with continued slight antalgic gait.   Pt will benefit from skilled Physical Therapy services to address deficits/limitations in order to improve functional and QOL.    OBJECTIVE IMPAIRMENTS: Abnormal gait, decreased activity tolerance, difficulty walking, decreased ROM, decreased strength, hypomobility, increased fascial restrictions, and impaired perceived functional ability.   ACTIVITY LIMITATIONS: lifting, bending, sitting, standing, squatting, sleeping, stairs, and locomotion level  PARTICIPATION LIMITATIONS: cleaning, laundry, driving, and occupation  REHAB POTENTIAL: Good  CLINICAL DECISION MAKING: Stable/uncomplicated  EVALUATION COMPLEXITY: Low   GOALS: Goals reviewed with patient? No  SHORT TERM GOALS: Target date: 06/08/2023 patient will be independent with initial HEP Baseline: Goal status: in progress  2.  Patient will self report 50% improvement to improve tolerance for functional activity  Baseline:  Goal status: in progress    LONG TERM GOALS: Target date: 06/22/2023  Patient will be independent in self management strategies to improve quality of life and functional outcomes.  Baseline:  Goal status: in progress   2.  Patient will self report 75% improvement to improve tolerance for functional activity  Baseline:  Goal status: in  progress   3.  Patient will increase right SLR to equal to left SLR Baseline:  Goal status: in progress  4.  Patient will improve LEFS score by 10 points or more to demonstrate improve perceived functional mobility Baseline: 37/80 Goal status: in progress  5.  Patient will ambulate x 15 minutes without leg pain to prepare for return to work activities Baseline:  Goal status: in progress    PLAN:  PT FREQUENCY: 2x/week  PT DURATION: 4 weeks  PLANNED INTERVENTIONS: 97164- PT Re-evaluation, 97110-Therapeutic exercises, 97530- Therapeutic activity, 97112- Neuromuscular re-education, 97535- Self Care, 10932- Manual therapy, [phone]- Gait training, [phone]- Orthotic Fit/training, [phone]- Canalith repositioning, [id number]- Aquatic Therapy, [phone]- Splinting, Patient/Family education, Balance training, Stair training, Taping, Dry Needling, Joint mobilization, Joint manipulation, Spinal manipulation, Spinal mobilization, Scar mobilization, and DME instructions.   PLAN FOR NEXT SESSION: begin with mobility; hamstring stretching and then initiate strengthening; bike if can tolerate   11:00 AM, 06/01/23 Khamora Karan Small Miqueas Whilden MPT [facility] [place] [phone]

## 2023-06-03 ENCOUNTER — Telehealth: Payer: Self-pay | Admitting: Family Medicine

## 2023-06-03 ENCOUNTER — Encounter: Payer: Self-pay | Admitting: Orthopaedic Surgery

## 2023-06-03 ENCOUNTER — Ambulatory Visit: Payer: BC Managed Care – PPO | Admitting: Orthopaedic Surgery

## 2023-06-03 ENCOUNTER — Telehealth: Payer: Self-pay | Admitting: Orthopaedic Surgery

## 2023-06-03 VITALS — BP 163/101 | HR 121

## 2023-06-03 DIAGNOSIS — S76311D Strain of muscle, fascia and tendon of the posterior muscle group at thigh level, right thigh, subsequent encounter: Secondary | ICD-10-CM

## 2023-06-03 NOTE — Telephone Encounter (Signed)
Copied from CRM [id number]. Topic: General - Other >> Jun 03, 2023  9:34 AM Shelah Lewandowsky wrote: Reason for CRM: Dena with [employer] calling to give fax number for leave of absence form, could  not open the one that was sent, please fax new form  to [phone]

## 2023-06-03 NOTE — Patient Instructions (Signed)
Work restriction form completed

## 2023-06-03 NOTE — Progress Notes (Signed)
I am a little better.  She has been to PT and is improving.  She still has pain of the right hamstring origin but is able to sit now and walk better.  She has out of work note until February 8 from her primary care.  I have completed form for work restrictions.  I will see her back end of February.  ROM of the right hip and leg is good today.  She is tender over the hamstring origin but not painful as she has been.  Gait is good.  She is able to sit down without pain.  NV intact.  Encounter Diagnosis  Name Primary?   Hamstring muscle strain, right, subsequent encounter Yes   Return in five weeks.  Call if any problem.  Precautions discussed.  Electronically Signed Darreld Mclean, MD 1/23/20258:55 AM

## 2023-06-03 NOTE — Telephone Encounter (Signed)
[employer] forms received. To [employer]. 

## 2023-06-03 NOTE — Telephone Encounter (Signed)
done  Copied from CRM [id number]. Topic: General - Other >> Jun 02, 2023  4:05 PM Geroge Baseman wrote: Reason for CRM: [employer] states that by today 1/22 they need a doctor to fill out disability paperwork attending physician statement. Claim specialist Carolan Clines, Fax [phone] [email or web address], Claim [phone]. Patient is right around the corner patient can bring paperwork right over if the office needs. >> Jun 03, 2023  8:29 AM Lattie Corns wrote: Called patient, per patietn she said someone else already called her that forms are at the front desk. Forms not at front desk sent nurse a message to locate these forms, patient will come by to sign today

## 2023-06-03 NOTE — Telephone Encounter (Signed)
All work FMLA forms that were given were faxed

## 2023-06-04 ENCOUNTER — Ambulatory Visit (HOSPITAL_COMMUNITY): Payer: BC Managed Care – PPO

## 2023-06-04 DIAGNOSIS — M25551 Pain in right hip: Secondary | ICD-10-CM | POA: Diagnosis not present

## 2023-06-04 DIAGNOSIS — S76311A Strain of muscle, fascia and tendon of the posterior muscle group at thigh level, right thigh, initial encounter: Secondary | ICD-10-CM | POA: Diagnosis not present

## 2023-06-04 DIAGNOSIS — S76311D Strain of muscle, fascia and tendon of the posterior muscle group at thigh level, right thigh, subsequent encounter: Secondary | ICD-10-CM | POA: Diagnosis not present

## 2023-06-04 NOTE — Therapy (Addendum)
OUTPATIENT PHYSICAL THERAPY LOWER EXTREMITY TREATMENT   Patient Name: [name] MRN: [id number] DOB:Mar 25, 1981, 43 y.o., female Today's Date: 06/04/2023  END OF SESSION:  PT End of Session - 06/04/23 1302     Visit Number 4    Number of Visits 8    Date for PT Re-Evaluation 06/22/23    Authorization Type BCBS    Authorization Time Period no auth required    Progress Note Due on Visit 8    PT Start Time 1302    PT Stop Time 1342    PT Time Calculation (min) 40 min    Activity Tolerance Patient tolerated treatment well    Behavior During Therapy [facility] for tasks assessed/performed              Past Medical History:  Diagnosis Date   Hypertension    Kidney stone    Past Surgical History:  Procedure Laterality Date   TUBAL LIGATION     Patient Active Problem List   Diagnosis Date Noted   Hamstring muscle strain, right, subsequent encounter 05/16/2023   Right hip pain 03/16/2023   Anxiety 01/18/2023   Hyperpigmentation of skin 12/11/2022   Neuropathy 11/02/2022   Panic attack as reaction to stress 10/15/2022   Bipolar 1 disorder (HCC) 10/15/2022   Cervical cancer screening 09/30/2022   Rash in adult 09/08/2022   Primary hypertension 09/08/2022   Nephrolithiasis 10/30/2019   Pyelonephritis 10/30/2019   Tobacco abuse 10/30/2019    PCP: Gilmore Laroche  REFERRING PROVIDER: Gilmore Laroche, FNP  REFERRING DIAG: [phone] (ICD-10-CM) - Hamstring muscle strain, right, subsequent encounter  THERAPY DIAG:  Pain in right hip  Strain of right hamstring, initial encounter  Rationale for Evaluation and Treatment: Rehabilitation  ONSET DATE: 05/05/2023  SUBJECTIVE:   SUBJECTIVE STATEMENT: Every day is a little better; still tender to the touch; tender to sit on right leg; still very difficult to put socks and shoes on.    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 Yes: NPRS scale: 3 Pain location: right  hamstring Pain description: tight and sore Aggravating factors: walking too much, sitting too much Relieving factors: laying with leg propped  PRECAUTIONS: None     WEIGHT BEARING RESTRICTIONS: No  FALLS:  Has patient fallen in last 6 months? Yes. Number of falls 1  OCCUPATION: manager at [employer]  PLOF: Independent  PATIENT GOALS: to be 100%  NEXT MD VISIT: sees Hilda Lias on Thursday 1/16  OBJECTIVE:  Note: Objective measures were completed at Evaluation unless otherwise noted.  DIAGNOSTIC FINDINGS:  CLINICAL DATA:  Recent fall with right hip pain, initial encounter   EXAM: DG HIP (WITH OR WITHOUT PELVIS) 2v RIGHT   COMPARISON:  03/16/2023   FINDINGS: Pelvic ring is intact. No acute fracture or dislocation is noted. No soft tissue abnormality is seen.   IMPRESSION: No acute abnormality noted.  PATIENT SURVEYS:  LEFS 37/80 46.3%  COGNITION: Overall cognitive status: Within functional limits for tasks assessed     SENSATION: WFL  EDEMA:  Not currently but did have swelling initially  MUSCLE LENGTH: Hamstrings: Right 38 deg; Left 70 deg AAROM  POSTURE: No Significant postural limitations  PALPATION: Tender origin and insertion areas  LOWER EXTREMITY ROM:  Active ROM Right eval Left eval  Hip flexion    Hip extension    Hip abduction    Hip adduction    Hip internal rotation    Hip external rotation  Knee flexion 110 122  Knee extension    Ankle dorsiflexion    Ankle plantarflexion    Ankle inversion    Ankle eversion     (Blank rows = not tested)  LOWER EXTREMITY MMT:  MMT Right eval Left eval  Hip flexion    Hip extension    Hip abduction    Hip adduction    Hip internal rotation    Hip external rotation    Knee flexion 3- 4+  Knee extension    Ankle dorsiflexion    Ankle plantarflexion    Ankle inversion    Ankle eversion     (Blank rows = not tested)  FUNCTIONAL TESTS:  5 times sit to stand: next  visit  GAIT: Distance walked: 80 ft in clinic Assistive device utilized: None Level of assistance: Modified independence Comments: decreased stance and stride length right lower extremity; slight antalgic gait; decreased gait speed                                                                                                                                TREATMENT DATE:  06/04/23 Supine: moist heat to the right hamstring to decrease soreness and increase soft tissue extensibility x 5 Towel right hamstring stretch 5 x 10" Right Figure 4 hamstring/glute stretch 5 x 10" Seated: Hamstring stretch 5 x 10" Nustep seat 8 x 5' for mobility Standing: Calf and soleus stretch 3 x 10" each Hip extension toe tap 2 x 10 Hip abduction toe tap 2 x 10 Hamstring curl 2 x 5 Mini squats 2 x 10 Sidestepping // bars down and back x 5  06/01/23 Supine: moist heat to the right hamstring to decrease soreness and increase soft tissue extensibility x 7' Active right hamstring stretch with towel 5 x 10" each Nustep seat 7 or 8 x 5' for mobility Standing: Calf stretch against the wall gastroc then soleus 5 x 10" each Hamstring stretch on step 7";  5 x 10" Updated HEP   05/28/2023  -Seated HS stretch 3 x 30'' -Prone STM with light theragun and manual pressure at distal hamstring and proximal lateral hip musculature x 8' -Prone knee flexion x 20 with 5'' eccentric control  -Knee bent prone hip extension x 10 w/ 5' -Clamshells 20x w/ 5'' iso -Supine bridge 20x 5'' iso  05/25/23 physical therapy evaluation and HEP instruction    PATIENT EDUCATION:  Education details: Patient educated on exam findings, POC, scope of PT, HEP, and what to expect next visit. Person educated: Patient Education method: Explanation, Demonstration, and Handouts Education comprehension: verbalized understanding, returned demonstration, verbal cues required, and tactile cues required  HOME EXERCISE PROGRAM: 06/01/23 calf  stretch, soleus stretch, standing hamstring stretch  Access Code: [email or web address] URL: [email or web address] Date: 05/25/2023 Prepared by: AP - Rehab  Exercises - Seated Hamstring Stretch  - 2 x daily - 7 x weekly - 1 sets - 5 reps - 20 sec hold -  Seated Table Hamstring Stretch  - 2 x daily - 7 x weekly - 1 sets - 5 reps - 20 sec hold - Supine Hamstring Stretch  - 2 x daily - 7 x weekly - 1 sets - 5 reps - 20 sec hold Access Code: [email or web address] URL: [email or web address] Date: 05/28/2023 Prepared by: Starling Manns  Exercises - Prone Knee Flexion AROM  - 1 x daily - 7 x weekly - 3 sets - 10 reps - Prone Hip Extension with Bent Knee  - 1 x daily - 7 x weekly - 3 sets - 10 reps - Clamshell  - 1 x daily - 7 x weekly - 3 sets - 10 reps - Supine Bridge  - 1 x daily - 7 x weekly - 3 sets - 10 reps ASSESSMENT:  CLINICAL IMPRESSION: Continued soreness so started with moist heat to decrease soreness and increase soft tissue extensibility.  Added a figure 4 stretch to try to address mobility issue with donning and doffing sock and shoes.  Noted tightness with right hip external rotation versus left.  Guarded motion with Nustep.   Added standing exercises without issue; mild soreness in hamstring with hamstring curls only.   Pt will benefit from skilled Physical Therapy services to address deficits/limitations in order to improve functional and QOL.    OBJECTIVE IMPAIRMENTS: Abnormal gait, decreased activity tolerance, difficulty walking, decreased ROM, decreased strength, hypomobility, increased fascial restrictions, and impaired perceived functional ability.   ACTIVITY LIMITATIONS: lifting, bending, sitting, standing, squatting, sleeping, stairs, and locomotion level  PARTICIPATION LIMITATIONS: cleaning, laundry, driving, and occupation  REHAB POTENTIAL: Good  CLINICAL DECISION MAKING: Stable/uncomplicated  EVALUATION COMPLEXITY: Low   GOALS: Goals reviewed with  patient? No  SHORT TERM GOALS: Target date: 06/08/2023 patient will be independent with initial HEP Baseline: Goal status: in progress  2.  Patient will self report 50% improvement to improve tolerance for functional activity  Baseline:  Goal status: in progress    LONG TERM GOALS: Target date: 06/22/2023  Patient will be independent in self management strategies to improve quality of life and functional outcomes.  Baseline:  Goal status: in progress   2.  Patient will self report 75% improvement to improve tolerance for functional activity  Baseline:  Goal status: in progress   3.  Patient will increase right SLR to equal to left SLR Baseline:  Goal status: in progress  4.  Patient will improve LEFS score by 10 points or more to demonstrate improve perceived functional mobility Baseline: 37/80 Goal status: in progress  5.  Patient will ambulate x 15 minutes without leg pain to prepare for return to work activities Baseline:  Goal status: in progress    PLAN:  PT FREQUENCY: 2x/week  PT DURATION: 4 weeks  PLANNED INTERVENTIONS: 97164- PT Re-evaluation, 97110-Therapeutic exercises, 97530- Therapeutic activity, 97112- Neuromuscular re-education, 97535- Self Care, 62130- Manual therapy, [phone]- Gait training, [phone]- Orthotic Fit/training, [phone]- Canalith repositioning, [id number]- Aquatic Therapy, [phone]- Splinting, Patient/Family education, Balance training, Stair training, Taping, Dry Needling, Joint mobilization, Joint manipulation, Spinal manipulation, Spinal mobilization, Scar mobilization, and DME instructions.   PLAN FOR NEXT SESSION: begin with mobility; hamstring stretching and then initiate strengthening; bike if can tolerate   1:42 PM, 06/04/23 Larayah Clute Small Arthi Mcdonald MPT [facility] [place] [phone]

## 2023-06-10 ENCOUNTER — Encounter (HOSPITAL_COMMUNITY): Payer: BC Managed Care – PPO

## 2023-06-10 ENCOUNTER — Telehealth (HOSPITAL_COMMUNITY): Payer: Self-pay

## 2023-06-10 NOTE — Telephone Encounter (Signed)
Called patient today for her 1st no show. Patient did not answer and was not able to leave a voice message because the mailbox is full.  [name]. Caleb Prigmore, PT, DPT, OCS Board-Certified Clinical Specialist in Orthopedic PT PT Compact Privilege # ([place]): [phone]

## 2023-06-15 ENCOUNTER — Ambulatory Visit (HOSPITAL_COMMUNITY): Payer: BC Managed Care – PPO | Attending: Family Medicine

## 2023-06-15 ENCOUNTER — Ambulatory Visit: Payer: BC Managed Care – PPO | Admitting: Family Medicine

## 2023-06-15 DIAGNOSIS — M25551 Pain in right hip: Secondary | ICD-10-CM | POA: Insufficient documentation

## 2023-06-15 DIAGNOSIS — S76311A Strain of muscle, fascia and tendon of the posterior muscle group at thigh level, right thigh, initial encounter: Secondary | ICD-10-CM | POA: Insufficient documentation

## 2023-06-15 NOTE — Therapy (Signed)
 OUTPATIENT PHYSICAL THERAPY LOWER EXTREMITY TREATMENT   Patient Name: [name] MRN: [id number] DOB:24-Jul-1980, 43 y.o., female Today's Date: 06/15/2023  END OF SESSION:  PT End of Session - 06/15/23 1109     Visit Number 5    Number of Visits 8    Date for PT Re-Evaluation 06/22/23    Authorization Type BC[employer]    Authorization Time Period no auth required    PT Start Time 1100    PT Stop Time 1140    PT Time Calculation (min) 40 min    Activity Tolerance Patient tolerated treatment well    Behavior During Therapy [facility] for tasks assessed/performed            Past Medical History:  Diagnosis Date   Hypertension    Kidney stone    Past Surgical History:  Procedure Laterality Date   TUBAL LIGATION     Patient Active Problem List   Diagnosis Date Noted   Hamstring muscle strain, right, subsequent encounter 05/16/2023   Right hip pain 03/16/2023   Anxiety 01/18/2023   Hyperpigmentation of skin 12/11/2022   Neuropathy 11/02/2022   Panic attack as reaction to stress 10/15/2022   Bipolar 1 disorder (HCC) 10/15/2022   Cervical cancer screening 09/30/2022   Rash in adult 09/08/2022   Primary hypertension 09/08/2022   Nephrolithiasis 10/30/2019   Pyelonephritis 10/30/2019   Tobacco abuse 10/30/2019    PCP: Zarwolo, Gloria  REFERRING PROVIDER: Zarwolo, Gloria, FNP  REFERRING DIAG: [phone] (ICD-10-CM) - Hamstring muscle strain, right, subsequent encounter  THERAPY DIAG:  Pain in right hip  Rationale for Evaluation and Treatment: Rehabilitation  ONSET DATE: 05/05/2023  SUBJECTIVE:   SUBJECTIVE STATEMENT: Patient states that she's not having pain today but area is still tender to touch. Still having difficulty putting her shoes and socks.  PERTINENT HISTORY: Works as a social research officer, government at [employer] PAIN:  Are you having pain? Yes: NPRS scale: 3 Pain location: right hamstring Pain description: tight and sore Aggravating factors: walking too much, sitting too  much Relieving factors: laying with leg propped  PRECAUTIONS: None     WEIGHT BEARING RESTRICTIONS: No  FALLS:  Has patient fallen in last 6 months? Yes. Number of falls 1  OCCUPATION: manager at [employer]  PLOF: Independent  PATIENT GOALS: to be 100%  NEXT MD VISIT: sees Brenna on Thursday 1/16  OBJECTIVE:  Note: Objective measures were completed at Evaluation unless otherwise noted.  DIAGNOSTIC FINDINGS:  CLINICAL DATA:  Recent fall with right hip pain, initial encounter   EXAM: DG HIP (WITH OR WITHOUT PELVIS) 2v RIGHT   COMPARISON:  03/16/2023   FINDINGS: Pelvic ring is intact. No acute fracture or dislocation is noted. No soft tissue abnormality is seen.   IMPRESSION: No acute abnormality noted.  PATIENT SURVEYS:  LEFS 37/80 46.3%  COGNITION: Overall cognitive status: Within functional limits for tasks assessed     SENSATION: WFL  EDEMA:  Not currently but did have swelling initially  MUSCLE LENGTH: Hamstrings: Right 38 deg; Left 70 deg AAROM  POSTURE: No Significant postural limitations  PALPATION: Tender origin and insertion areas  LOWER EXTREMITY ROM:  Active ROM Right eval Left eval  Hip flexion    Hip extension    Hip abduction    Hip adduction    Hip internal rotation    Hip external rotation    Knee flexion 110 122  Knee extension    Ankle dorsiflexion    Ankle plantarflexion  Ankle inversion    Ankle eversion     (Blank rows = not tested)  LOWER EXTREMITY MMT:  MMT Right eval Left eval  Hip flexion    Hip extension    Hip abduction    Hip adduction    Hip internal rotation    Hip external rotation    Knee flexion 3- 4+  Knee extension    Ankle dorsiflexion    Ankle plantarflexion    Ankle inversion    Ankle eversion     (Blank rows = not tested)  FUNCTIONAL TESTS:  5 times sit to stand: next visit  GAIT: Distance walked: 80 ft in clinic Assistive device utilized: None Level of assistance: Modified  independence Comments: decreased stance and stride length right lower extremity; slight antalgic gait; decreased gait speed                                                                                                                                TREATMENT DATE:  06/15/23 Recumbent bike, seat 8, level 1, 5' Gr. 2 ant hip glides in prone x 2' Bridges x 3 x 10 x 2 Bridges with hip abd x 3 x 10 x 2, RTB Seated piriformis stretch x 30 Supine piriformis stretch x 30 x 2 Plank (on elbows) x 3 x 10 SLR x 10 x 2  06/04/23 Supine: moist heat to the right hamstring to decrease soreness and increase soft tissue extensibility x 5 Towel right hamstring stretch 5 x 10 Right Figure 4 hamstring/glute stretch 5 x 10 Seated: Hamstring stretch 5 x 10 Nustep seat 8 x 5' for mobility Standing: Calf and soleus stretch 3 x 10 each Hip extension toe tap 2 x 10 Hip abduction toe tap 2 x 10 Hamstring curl 2 x 5 Mini squats 2 x 10 Sidestepping // bars down and back x 5  06/01/23 Supine: moist heat to the right hamstring to decrease soreness and increase soft tissue extensibility x 7' Active right hamstring stretch with towel 5 x 10 each Nustep seat 7 or 8 x 5' for mobility Standing: Calf stretch against the wall gastroc then soleus 5 x 10 each Hamstring stretch on step 7;  5 x 10 Updated HEP   05/28/2023  -Seated HS stretch 3 x 30'' -Prone STM with light theragun and manual pressure at distal hamstring and proximal lateral hip musculature x 8' -Prone knee flexion x 20 with 5'' eccentric control  -Knee bent prone hip extension x 10 w/ 5' -Clamshells 20x w/ 5'' iso -Supine bridge 20x 5'' iso  05/25/23 physical therapy evaluation and HEP instruction    PATIENT EDUCATION:  Education details: Patient educated on exam findings, POC, scope of PT, HEP, and what to expect next visit. Person educated: Patient Education method: Explanation, Demonstration, and Handouts Education  comprehension: verbalized understanding, returned demonstration, verbal cues required, and tactile cues required  HOME EXERCISE PROGRAM: 06/01/23 calf stretch, soleus stretch, standing  hamstring stretch  Access Code: [facility] URL: [email or web address] Date: 05/25/2023 Prepared by: AP - Rehab  Exercises - Seated Hamstring Stretch  - 2 x daily - 7 x weekly - 1 sets - 5 reps - 20 sec hold - Seated Table Hamstring Stretch  - 2 x daily - 7 x weekly - 1 sets - 5 reps - 20 sec hold - Supine Hamstring Stretch  - 2 x daily - 7 x weekly - 1 sets - 5 reps - 20 sec hold Access Code: [email or web address] URL: [email or web address] Date: 05/28/2023 Prepared by: Omega Bottcher  Exercises - Prone Knee Flexion AROM  - 1 x daily - 7 x weekly - 3 sets - 10 reps - Prone Hip Extension with Bent Knee  - 1 x daily - 7 x weekly - 3 sets - 10 reps - Clamshell  - 1 x daily - 7 x weekly - 3 sets - 10 reps - Supine Bridge  - 1 x daily - 7 x weekly - 3 sets - 10 reps ASSESSMENT:  CLINICAL IMPRESSION: Interventions today were geared towards hip mobility, core and LE strengthening. Tolerated all activities without worsening of symptoms. Demonstrated mild levels of fatigue. Provided slight amount of cueing to ensure correct execution of activity with good carry-over.  Tolerated hip mob well without discomfort. Attempted side planks but patient was unable due to weakness. To date, skilled PT is required to address the impairments and improve function.    OBJECTIVE IMPAIRMENTS: Abnormal gait, decreased activity tolerance, difficulty walking, decreased ROM, decreased strength, hypomobility, increased fascial restrictions, and impaired perceived functional ability.   ACTIVITY LIMITATIONS: lifting, bending, sitting, standing, squatting, sleeping, stairs, and locomotion level  PARTICIPATION LIMITATIONS: cleaning, laundry, driving, and occupation  REHAB POTENTIAL: Good  CLINICAL DECISION MAKING:  Stable/uncomplicated  EVALUATION COMPLEXITY: Low   GOALS: Goals reviewed with patient? No  SHORT TERM GOALS: Target date: 06/08/2023 patient will be independent with initial HEP Baseline: Goal status: in progress  2.  Patient will self report 50% improvement to improve tolerance for functional activity  Baseline:  Goal status: in progress    LONG TERM GOALS: Target date: 06/22/2023  Patient will be independent in self management strategies to improve quality of life and functional outcomes.  Baseline:  Goal status: in progress   2.  Patient will self report 75% improvement to improve tolerance for functional activity  Baseline:  Goal status: in progress   3.  Patient will increase right SLR to equal to left SLR Baseline:  Goal status: in progress  4.  Patient will improve LEFS score by 10 points or more to demonstrate improve perceived functional mobility Baseline: 37/80 Goal status: in progress  5.  Patient will ambulate x 15 minutes without leg pain to prepare for return to work activities Baseline:  Goal status: in progress    PLAN:  PT FREQUENCY: 2x/week  PT DURATION: 4 weeks  PLANNED INTERVENTIONS: 97164- PT Re-evaluation, 97110-Therapeutic exercises, 97530- Therapeutic activity, 97112- Neuromuscular re-education, 97535- Self Care, [id number]- Manual therapy, [phone]- Gait training, [phone]- Orthotic Fit/training, [phone]- Canalith repositioning, [id number]- Aquatic Therapy, [phone]- Splinting, Patient/Family education, Balance training, Stair training, Taping, Dry Needling, Joint mobilization, Joint manipulation, Spinal manipulation, Spinal mobilization, Scar mobilization, and DME instructions.   PLAN FOR NEXT SESSION: begin with mobility; hamstring stretching and then initiate strengthening; bike if can tolerate  Vinie L. Katlyne Nishida, PT, DPT, OCS Board-Certified Clinical Specialist in Orthopedic PT PT Compact Privilege # ([place]): [id number] T  11:09 AM, 06/15/23

## 2023-06-16 NOTE — Telephone Encounter (Signed)
 Copied from CRM [id number]. Topic: General - Other >> Jun 16, 2023  2:18 PM Fredrica W wrote: Reason for CRM: Patient called requested to speak to Caelyn about documents that she sent to [facility]. Per [employer] the fax was not received. Their fax was down. Patient would like emailed to [email or web address] if possible. Patient states she is supposed to return to work 2/8 but not bale to until the receive the requested documentation. Thank You

## 2023-06-17 ENCOUNTER — Encounter: Payer: Self-pay | Admitting: Family Medicine

## 2023-06-17 ENCOUNTER — Ambulatory Visit: Payer: BC Managed Care – PPO | Admitting: Family Medicine

## 2023-06-17 VITALS — BP 147/89 | HR 81 | Ht 66.0 in | Wt 189.0 lb

## 2023-06-17 DIAGNOSIS — R399 Unspecified symptoms and signs involving the genitourinary system: Secondary | ICD-10-CM

## 2023-06-17 DIAGNOSIS — R5383 Other fatigue: Secondary | ICD-10-CM | POA: Diagnosis not present

## 2023-06-17 DIAGNOSIS — I10 Essential (primary) hypertension: Secondary | ICD-10-CM | POA: Diagnosis not present

## 2023-06-17 DIAGNOSIS — E559 Vitamin D deficiency, unspecified: Secondary | ICD-10-CM | POA: Diagnosis not present

## 2023-06-17 DIAGNOSIS — Z1211 Encounter for screening for malignant neoplasm of colon: Secondary | ICD-10-CM

## 2023-06-17 DIAGNOSIS — D509 Iron deficiency anemia, unspecified: Secondary | ICD-10-CM | POA: Diagnosis not present

## 2023-06-17 DIAGNOSIS — R350 Frequency of micturition: Secondary | ICD-10-CM

## 2023-06-17 MED ORDER — AMLODIPINE BESYLATE 2.5 MG PO TABS: 2.5000 mg | ORAL_TABLET | Freq: Every day | ORAL | 2 refills | Status: AC

## 2023-06-17 NOTE — Progress Notes (Signed)
 Established Patient Office Visit   Subjective  Patient ID: [name], female    DOB: 1980-12-17  Age: 43 y.o. MRN: [id number]  Chief Complaint  Patient presents with   Follow-up    3 mo HTN Feeling as if medication is not working as it should. ( Lack of energy, hard to breathe)  Frequent urgency of urination     She  has a past medical history of Hypertension and Kidney stone.  HPI Patient presents to the clinic for hypertension follow up. For the details of today's visit, please refer to assessment and plan.   Review of Systems  Constitutional:  Negative for chills and fever.  Respiratory:  Negative for shortness of breath.   Cardiovascular:  Negative for chest pain.  Neurological:  Negative for dizziness and headaches.      Objective:     BP (!) 147/89   Pulse 81   Ht 5' 6 (1.676 m)   Wt 189 lb (85.7 kg)   LMP 05/25/2023   SpO2 97%   BMI 30.51 kg/m  BP Readings from Last 3 Encounters:  06/17/23 (!) 147/89  06/03/23 (!) 163/101  05/27/23 (!) 144/95      Physical Exam Vitals reviewed.  Constitutional:      General: She is not in acute distress.    Appearance: Normal appearance. She is not ill-appearing, toxic-appearing or diaphoretic.  HENT:     Head: Normocephalic.  Eyes:     General:        Right eye: No discharge.        Left eye: No discharge.     Conjunctiva/sclera: Conjunctivae normal.  Cardiovascular:     Rate and Rhythm: Normal rate.     Pulses: Normal pulses.     Heart sounds: Normal heart sounds.  Pulmonary:     Effort: Pulmonary effort is normal. No respiratory distress.     Breath sounds: Normal breath sounds.  Abdominal:     Tenderness: There is no abdominal tenderness. There is no guarding.  Skin:    General: Skin is warm and dry.     Capillary Refill: Capillary refill takes less than 2 seconds.  Neurological:     Mental Status: She is alert.  Psychiatric:        Mood and Affect: Mood normal.      No results found for  any visits on 06/17/23.  The 10-year ASCVD risk score (Arnett DK, et al., 2019) is: 2.4%    Assessment & Plan:  Primary hypertension Assessment & Plan: Vitals:   06/17/23 1526 06/17/23 1613  BP: (!) 146/92 (!) 147/89  Continue Olmesartan  20 mg once daily, Start Amlodipine  2.5 mg once daily Follow up in 4 weeks with PCP with at home blood pressure readings Labs ordered. Discussed with  patient to monitor their blood pressure regularly and maintain a heart-healthy diet rich in fruits, vegetables, whole grains, and low-fat dairy, while reducing sodium intake to less than 2,300 mg per day. Regular physical activity, such as 30 minutes of moderate exercise most days of the week, will help lower blood pressure and improve overall cardiovascular health. Avoiding smoking, limiting alcohol consumption, and managing stress. Take  prescribed medication, & take it as directed and avoid skipping doses. Seek emergency care if your blood pressure is (over 180/100) or you experience chest pain, shortness of breath, or sudden vision changes.Patient verbalizes understanding regarding plan of care and all questions answered.    Other fatigue  Iron deficiency anemia,  unspecified iron deficiency anemia type  Vitamin D  deficiency  Urinary symptom or sign -     Urine Culture -     Urinalysis  Screening for colon cancer -     Cologuard  Urinary frequency Assessment & Plan: Urinalysis and Urine culture ordered to rule out UTI Possible OAB Discussed staying hydrated, avoiding caffeine and alcohol, and maintaining a healthy weight can also help reduce symptoms.   Other orders -     amLODIPine  Besylate; Take 1 tablet (2.5 mg total) by mouth daily.  Dispense: 30 tablet; Refill: 2    Return in about 4 weeks (around 07/15/2023), or if symptoms worsen or fail to improve, for re-check blood pressure, with PCP [place].   Hilario Kidd Wilhelmena Falter, FNP

## 2023-06-17 NOTE — Patient Instructions (Signed)

## 2023-06-17 NOTE — Assessment & Plan Note (Signed)
 Vitals:   06/17/23 1526 06/17/23 1613  BP: (!) 146/92 (!) 147/89  Continue Olmesartan  20 mg once daily, Start Amlodipine  2.5 mg once daily Follow up in 4 weeks with PCP with at home blood pressure readings Labs ordered. Discussed with  patient to monitor their blood pressure regularly and maintain a heart-healthy diet rich in fruits, vegetables, whole grains, and low-fat dairy, while reducing sodium intake to less than 2,300 mg per day. Regular physical activity, such as 30 minutes of moderate exercise most days of the week, will help lower blood pressure and improve overall cardiovascular health. Avoiding smoking, limiting alcohol consumption, and managing stress. Take  prescribed medication, & take it as directed and avoid skipping doses. Seek emergency care if your blood pressure is (over 180/100) or you experience chest pain, shortness of breath, or sudden vision changes.Patient verbalizes understanding regarding plan of care and all questions answered.

## 2023-06-17 NOTE — Assessment & Plan Note (Signed)
 Urinalysis and Urine culture ordered to rule out UTI Possible OAB Discussed staying hydrated, avoiding caffeine and alcohol, and maintaining a healthy weight can also help reduce symptoms.

## 2023-06-17 NOTE — Telephone Encounter (Signed)
 Forms emailed

## 2023-06-18 ENCOUNTER — Encounter (HOSPITAL_COMMUNITY): Payer: BC Managed Care – PPO

## 2023-06-18 ENCOUNTER — Emergency Department (HOSPITAL_COMMUNITY): Payer: BC Managed Care – PPO

## 2023-06-18 ENCOUNTER — Encounter (HOSPITAL_COMMUNITY): Payer: Self-pay | Admitting: *Deleted

## 2023-06-18 ENCOUNTER — Emergency Department (HOSPITAL_COMMUNITY)
Admission: EM | Admit: 2023-06-18 | Discharge: 2023-06-18 | Disposition: A | Discharge status: Home / Self Care | Payer: BC Managed Care – PPO | Attending: Emergency Medicine | Admitting: Emergency Medicine

## 2023-06-18 ENCOUNTER — Other Ambulatory Visit: Payer: Self-pay

## 2023-06-18 DIAGNOSIS — Z720 Tobacco use: Secondary | ICD-10-CM | POA: Insufficient documentation

## 2023-06-18 DIAGNOSIS — R350 Frequency of micturition: Secondary | ICD-10-CM

## 2023-06-18 DIAGNOSIS — R197 Diarrhea, unspecified: Secondary | ICD-10-CM | POA: Diagnosis not present

## 2023-06-18 DIAGNOSIS — Z79899 Other long term (current) drug therapy: Secondary | ICD-10-CM | POA: Insufficient documentation

## 2023-06-18 DIAGNOSIS — K921 Melena: Secondary | ICD-10-CM

## 2023-06-18 DIAGNOSIS — E7849 Other hyperlipidemia: Secondary | ICD-10-CM | POA: Diagnosis not present

## 2023-06-18 DIAGNOSIS — R079 Chest pain, unspecified: Secondary | ICD-10-CM | POA: Diagnosis not present

## 2023-06-18 DIAGNOSIS — R200 Anesthesia of skin: Secondary | ICD-10-CM | POA: Diagnosis not present

## 2023-06-18 DIAGNOSIS — I10 Essential (primary) hypertension: Secondary | ICD-10-CM | POA: Diagnosis not present

## 2023-06-18 DIAGNOSIS — E559 Vitamin D deficiency, unspecified: Secondary | ICD-10-CM | POA: Diagnosis not present

## 2023-06-18 DIAGNOSIS — R7301 Impaired fasting glucose: Secondary | ICD-10-CM | POA: Diagnosis not present

## 2023-06-18 DIAGNOSIS — E038 Other specified hypothyroidism: Secondary | ICD-10-CM | POA: Diagnosis not present

## 2023-06-18 DIAGNOSIS — R0789 Other chest pain: Secondary | ICD-10-CM | POA: Diagnosis not present

## 2023-06-18 DIAGNOSIS — R002 Palpitations: Secondary | ICD-10-CM | POA: Diagnosis not present

## 2023-06-18 LAB — BASIC METABOLIC PANEL
Anion gap: 8 (ref 5–15)
BUN: 11 mg/dL (ref 6–20)
CO2: 25 mmol/L (ref 22–32)
Calcium: 9 mg/dL (ref 8.9–10.3)
Chloride: 101 mmol/L (ref 98–111)
Creatinine, Ser: 0.75 mg/dL (ref 0.44–1.00)
GFR, Estimated: >60 mL/min (ref >60)
Glucose, Bld: 103 mg/dL — ABNORMAL HIGH (ref 70–99)
Potassium: 3.5 mmol/L (ref 3.5–5.1)
Sodium: 134 mmol/L — ABNORMAL LOW (ref 135–145)

## 2023-06-18 LAB — URINALYSIS, ROUTINE W REFLEX MICROSCOPIC
Bacteria, UA: NONE SEEN
Bilirubin Urine: NEGATIVE
Glucose, UA: NEGATIVE mg/dL
Ketones, ur: NEGATIVE mg/dL
Nitrite: NEGATIVE
Protein, ur: NEGATIVE mg/dL
Specific Gravity, Urine: 1.02 (ref 1.005–1.030)
pH: 5 (ref 5.0–8.0)

## 2023-06-18 LAB — URINALYSIS
Bilirubin, UA: NEGATIVE
Glucose, UA: NEGATIVE
Ketones, UA: NEGATIVE
Leukocytes,UA: NEGATIVE
Nitrite, UA: NEGATIVE
RBC, UA: NEGATIVE
Specific Gravity, UA: >1.03 — ABNORMAL HIGH (ref 1.005–1.030)
Urobilinogen, Ur: 0.2 mg/dL (ref 0.2–1.0)
pH, UA: 5.5 (ref 5.0–7.5)

## 2023-06-18 LAB — CBC WITH DIFFERENTIAL/PLATELET
Abs Immature Granulocytes: 0.02 10*3/uL (ref 0.00–0.07)
Basophils Absolute: 0.1 10*3/uL (ref 0.0–0.1)
Basophils Relative: 1 %
Eosinophils Absolute: 0.2 10*3/uL (ref 0.0–0.5)
Eosinophils Relative: 3 %
HCT: 39.3 % (ref 36.0–46.0)
Hemoglobin: 13.7 g/dL (ref 12.0–15.0)
Immature Granulocytes: 0 %
Lymphocytes Relative: 31 %
Lymphs Abs: 2.4 10*3/uL (ref 0.7–4.0)
MCH: 33.7 pg (ref 26.0–34.0)
MCHC: 34.9 g/dL (ref 30.0–36.0)
MCV: 96.8 fL (ref 80.0–100.0)
Monocytes Absolute: 0.5 10*3/uL (ref 0.1–1.0)
Monocytes Relative: 6 %
Neutro Abs: 4.5 10*3/uL (ref 1.7–7.7)
Neutrophils Relative %: 59 %
Platelets: 241 10*3/uL (ref 150–400)
RBC: 4.06 MIL/uL (ref 3.87–5.11)
RDW: 12.8 % (ref 11.5–15.5)
WBC: 7.7 10*3/uL (ref 4.0–10.5)
nRBC: 0 % (ref 0.0–0.2)

## 2023-06-18 LAB — TROPONIN I (HIGH SENSITIVITY)
Troponin I (High Sensitivity): 2 ng/L (ref <18)
Troponin I (High Sensitivity): <2 ng/L (ref <18)

## 2023-06-18 LAB — LIPASE, BLOOD: Lipase: 39 U/L (ref 11–51)

## 2023-06-18 NOTE — Discharge Instructions (Addendum)
 Thank you for letting us  evaluate you today. You cardiac enzymes were negative for cardiac injury making concern for heart injury low. Your urine was contaminated but only showed blood in it likely due to upcoming menses  Please make sure to f/u with PCP regarding CP, diarrhea, BP in one month. Hopefully all are resolved

## 2023-06-18 NOTE — ED Triage Notes (Signed)
 Pt with chest tightness and palpitations for last 2-3 days constant.  C/o numbness to bilateral hands and feet at times.  Pt with diarrhea past two weeks, noted blood 4 days ago.  Stools are yellow in color per pt.  + urgency with urination.

## 2023-06-18 NOTE — ED Provider Notes (Signed)
 [place] EMERGENCY DEPARTMENT AT [facility] Provider Note   CSN: [id number] Arrival date & time: 06/18/23  1533     History  Chief Complaint  Patient presents with   Chest Pain    [name] is a 43 y.o. female with past medical history of pyelonephritis, nephrolithiasis, HTN, bipolar 1, tobacco abuse presents to emergency department for intermittent random sharp substernal chest pain for the past 2 weeks.  She denies chest pain worsening with exertion and that it can come on when she is sitting and not being active.  She also notes 2 weeks of diarrhea.  She does describes that diarrhea starts as a loose bowel movement in the morning and typically becomes more solid throughout the day.  She recently stopped antibiotic for UTI 5 days ago. She endorses some drops of blood in toilet following BM without rectal pain.  She was evaluated by PCP yesterday for hypertension, fatigue, IDA, urinary frequency.  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 UA was negative for infection. She is to f/u in four weeks for BP recheck   Chest Pain Associated symptoms: no abdominal pain, no cough, no dizziness, no fatigue, no fever, no headache, no nausea, no numbness, no palpitations, no shortness of breath, no vomiting and no weakness      Home Medications Prior to Admission medications   Medication Sig Start Date End Date Taking? Authorizing Provider  acetaminophen  (TYLENOL ) 500 MG tablet Take 500 mg by mouth every 6 (six) hours as needed for headache.    [provider]  amLODipine  (NORVASC ) 2.5 MG tablet Take 1 tablet (2.5 mg total) by mouth daily. 06/17/23   Del Wilhelmena Lloyd Sola, FNP  cephALEXin  (KEFLEX ) 500 MG capsule Take 1 capsule (500 mg total) by mouth 4 (four) times daily. Patient not taking: Reported on 06/17/2023 04/08/23   Triplett, Tammy, PA-C  cyclobenzaprine  (FLEXERIL ) 5 MG tablet Take 1 tablet  (5 mg total) by mouth 3 (three) times daily as needed for muscle spasms. Patient not taking: Reported on 06/17/2023 05/06/23   Leath-Warren, Etta PARAS, NP  ELDERBERRY PO Take by mouth. Patient not taking: Reported on 06/17/2023    [provider]  olmesartan  (BENICAR ) 20 MG tablet Take 1 tablet by mouth once daily 04/22/23   Zarwolo, Gloria, FNP  tretinoin  (RETIN-A ) 0.025 % cream Apply topically at bedtime. 12/07/22   Zarwolo, Gloria, FNP  Vitamin D , Ergocalciferol , (DRISDOL ) 1.25 MG (50000 UNIT) CAPS capsule Take 1 capsule (50,000 Units total) by mouth every 7 (seven) days. Patient not taking: Reported on 06/17/2023 09/30/22   Zarwolo, Gloria, FNP      Allergies    Patient has no known allergies.    Review of Systems   Review of Systems  Constitutional:  Negative for chills, fatigue and fever.  Respiratory:  Negative for cough, chest tightness, shortness of breath and wheezing.   Cardiovascular:  Positive for chest pain. Negative for palpitations.  Gastrointestinal:  Negative for abdominal pain, constipation, diarrhea, nausea and vomiting.  Neurological:  Negative for dizziness, seizures, weakness, light-headedness, numbness and headaches.    Physical Exam Updated Vital Signs BP (!) 165/98 (BP Location: Left Arm)   Pulse 76   Temp 98.7 F (37.1 C) (Oral)   Resp 20   Ht 5' 6 (1.676 m)   Wt 85.7 kg   LMP 05/25/2023   SpO2 98%   BMI 30.51 kg/m  Physical Exam  Vitals and nursing note reviewed.  Constitutional:      General: She is not in acute distress.    Appearance: Normal appearance. She is not ill-appearing.  HENT:     Head: Normocephalic and atraumatic.  Eyes:     Conjunctiva/sclera: Conjunctivae normal.  Cardiovascular:     Rate and Rhythm: Normal rate.     Heart sounds: Normal heart sounds. No murmur heard. Pulmonary:     Effort: Pulmonary effort is normal. No respiratory distress.     Breath sounds: Normal breath sounds.  Chest:     Chest wall: No tenderness.   Abdominal:     General: Bowel sounds are normal. There is no distension.     Palpations: Abdomen is soft.     Tenderness: There is no abdominal tenderness. There is no guarding or rebound.  Musculoskeletal:     Cervical back: Normal range of motion and neck supple. No rigidity.     Right lower leg: No edema.     Left lower leg: No edema.     Comments: No swelling nor tenderness to BLE.  [place]' sign negative bilaterally  Skin:    Coloration: Skin is not jaundiced or pale.  Neurological:     Mental Status: She is alert and oriented to person, place, and time. Mental status is at baseline.     Motor: No weakness.     Coordination: Coordination normal.     Gait: Gait normal.     Comments: Patient acting appropriately.  Ambulates without difficulty     ED Results / Procedures / Treatments   Labs (all labs ordered are listed, but only abnormal results are displayed) Labs Reviewed  BASIC METABOLIC PANEL - Abnormal; Notable for the following components:      Result Value   Sodium 134 (*)    Glucose, Bld 103 (*)    All other components within normal limits  URINALYSIS, ROUTINE W REFLEX MICROSCOPIC - Abnormal; Notable for the following components:   APPearance HAZY (*)    Hgb urine dipstick MODERATE (*)    Leukocytes,Ua SMALL (*)    All other components within normal limits  GASTROINTESTINAL PANEL BY PCR, STOOL (REPLACES STOOL CULTURE)  CBC WITH DIFFERENTIAL/PLATELET  LIPASE, BLOOD  TROPONIN I (HIGH SENSITIVITY)  TROPONIN I (HIGH SENSITIVITY)    EKG None  Radiology DG Chest 2 View Result Date: 06/18/2023 CLINICAL DATA:  Chest tightness and palpitations. EXAM: CHEST - 2 VIEW COMPARISON:  Chest x-ray dated January 14, 2023. FINDINGS: The heart size and mediastinal contours are within normal limits. Both lungs are clear. The visualized skeletal structures are unremarkable. IMPRESSION: No active cardiopulmonary disease. Electronically Signed   By: Elsie ONEIDA Shoulder M.D.   On:  06/18/2023 17:12    Procedures Procedures    Medications Ordered in ED Medications - No data to display  ED Course/ Medical Decision Making/ A&P                                 Medical Decision Making Amount and/or Complexity of Data Reviewed Labs: ordered. Radiology: ordered.   Patient presents to the ED for concern of chest pain, blood in stool, diarrhea, this involves an extensive number of treatment options, and is a complaint that carries with it a high risk of complications and morbidity.  The differential diagnosis of chest pain includes ACS, pneumonia The differential diagnosis of blood in stool is internal or external hemorrhoids,  scant blood following 2 weeks of loose diarrhea Differential diagnosis of diarrhea includes viral versus bacterial gastroenteritis, loose stool from moderate EtOH, recent antibiotic use, current menses   Co morbidities that complicate the patient evaluation  See hpi   Additional history obtained:  Additional history obtained from Nursing and Outside Medical Records   External records from outside source obtained and reviewed including triage RN note PCP office visit from yesterday Noting hypertension of 150/90 Placed on 2.5 amlodipine    Lab Tests:  I Ordered, and personally interpreted labs.  The pertinent results include:   Trop negative x 2 Sodium 134 CBG 103 UA significant for hemoglobin   Imaging Studies ordered:  I ordered imaging studies including CXR  I independently visualized and interpreted imaging which showed no cardiopulmonary dx I agree with the radiologist interpretation   Cardiac Monitoring:  The patient was maintained on a cardiac monitor.  I personally viewed and interpreted the cardiac monitored which showed an underlying rhythm of: NSR with no STE nor ischemia     Problem List / ED Course:  Chest pain Trops  negative x 2.  EKG shows NSR Currently not having pain in ED.  As pain does not worsen  with exertion and is intermittent, pain could be due to premature complexes or palpitations or anxiety.  However, do not feel it is related to ACS at this time due to reassuring workup.  Will have patient follow-up with PCP as she has low heart score Diarrhea Is not having multiple episodes of diarrhea a day and is likely secondary to recent antibiotic use and moderate alcohol use.  She reports that she drinks many shots of liquor at night and may contribute to loose stools in the morning.  However, I did order stool culture in case it is infectious and will have patient bring sample for culture.  I have low suspicion that this is the case as she has 2 other very likely causes and has no blood, fever.  Her electrolytes are all WNL. I also offered flu testing however she refuses I discussed using Imodium as necessary as it does not sound infectious as well as maintaining oral hydration Blood in stool Sounds scant in nature.  She states that it does not discolor toilet water and typically she notices it in toilet paper when wiping.  I discussed importance of GU exam to ensure no external or internal hemorrhoids as well as rectal pain.  However, she adamantly refuses.  I discussed risks of this and she is compliant to refuse at this time.  Hemoglobin WNL.   Will have patient follow-up with PCP regarding this Urinary frequency 2 UAs that do not show infection.  She recently stopped Keflex  for prior infection so having another 1 so soon after is unlikely.  Culture by PCP is pending As patient has moderate alcohol use this may be related and/or overactive bladder.  PCP is following this Mod hemoglobin is noted in blood and patient reports that she is starting her menses.  She has no flank pain nor other urinary symptoms   Reevaluation:  After the interventions noted above, I reevaluated the patient and found that they have :improved   Social Determinants of Health:  Has PCP  follow-up   Dispostion:  After consideration of the diagnostic results and the patients response to treatment, I feel that the patent would benefit from outpt management PCP follow-up.    Final Clinical Impression(s) / ED Diagnoses Final diagnoses:  Chest pain, unspecified  type  Diarrhea, unspecified type  Flecks of blood in stool  Frequency of urination    Rx / DC Orders ED Discharge Orders     None         Minnie Tinnie BRAVO, PA 06/18/23 2303    Patsey Lot, MD 06/19/23 [phone]

## 2023-06-19 LAB — CBC WITH DIFFERENTIAL/PLATELET
Basophils Absolute: 0.1 10*3/uL (ref 0.0–0.2)
Basos: 1 %
EOS (ABSOLUTE): 0.2 10*3/uL (ref 0.0–0.4)
Eos: 3 %
Hematocrit: 40.6 % (ref 34.0–46.6)
Hemoglobin: 13.8 g/dL (ref 11.1–15.9)
Immature Grans (Abs): 0 10*3/uL (ref 0.0–0.1)
Immature Granulocytes: 0 %
Lymphocytes Absolute: 2.5 10*3/uL (ref 0.7–3.1)
Lymphs: 35 %
MCH: 33.7 pg — ABNORMAL HIGH (ref 26.6–33.0)
MCHC: 34 g/dL (ref 31.5–35.7)
MCV: 99 fL — ABNORMAL HIGH (ref 79–97)
Monocytes Absolute: 0.5 10*3/uL (ref 0.1–0.9)
Monocytes: 6 %
Neutrophils Absolute: 4 10*3/uL (ref 1.4–7.0)
Neutrophils: 55 %
Platelets: 269 10*3/uL (ref 150–450)
RBC: 4.09 x10E6/uL (ref 3.77–5.28)
RDW: 12.4 % (ref 11.7–15.4)
WBC: 7.3 10*3/uL (ref 3.4–10.8)

## 2023-06-19 LAB — LIPID PANEL
Chol/HDL Ratio: 2.4 {ratio} (ref 0.0–4.4)
Cholesterol, Total: 189 mg/dL (ref 100–199)
HDL: 78 mg/dL (ref >39)
LDL Chol Calc (NIH): 75 mg/dL (ref 0–99)
Triglycerides: 227 mg/dL — ABNORMAL HIGH (ref 0–149)
VLDL Cholesterol Cal: 36 mg/dL (ref 5–40)

## 2023-06-19 LAB — CMP14+EGFR
ALT: 17 [IU]/L (ref 0–32)
AST: 23 [IU]/L (ref 0–40)
Albumin: 4.2 g/dL (ref 3.9–4.9)
Alkaline Phosphatase: 59 [IU]/L (ref 44–121)
BUN/Creatinine Ratio: 13 (ref 9–23)
BUN: 8 mg/dL (ref 6–24)
Bilirubin Total: 0.8 mg/dL (ref 0.0–1.2)
CO2: 23 mmol/L (ref 20–29)
Calcium: 9.5 mg/dL (ref 8.7–10.2)
Chloride: 103 mmol/L (ref 96–106)
Creatinine, Ser: 0.63 mg/dL (ref 0.57–1.00)
Globulin, Total: 2.6 g/dL (ref 1.5–4.5)
Glucose: 78 mg/dL (ref 70–99)
Potassium: 3.7 mmol/L (ref 3.5–5.2)
Sodium: 140 mmol/L (ref 134–144)
Total Protein: 6.8 g/dL (ref 6.0–8.5)
eGFR: 111 mL/min/{1.73_m2} (ref >59)

## 2023-06-19 LAB — VITAMIN D 25 HYDROXY (VIT D DEFICIENCY, FRACTURES): Vit D, 25-Hydroxy: 23.7 ng/mL — ABNORMAL LOW (ref 30.0–100.0)

## 2023-06-19 LAB — TSH+FREE T4
Free T4: 1.34 ng/dL (ref 0.82–1.77)
TSH: 2.33 u[IU]/mL (ref 0.450–4.500)

## 2023-06-19 LAB — HEMOGLOBIN A1C
Est. average glucose Bld gHb Est-mCnc: 100 mg/dL
Hgb A1c MFr Bld: 5.1 % (ref 4.8–5.6)

## 2023-06-20 ENCOUNTER — Encounter: Payer: Self-pay | Admitting: Family Medicine

## 2023-06-20 LAB — URINE CULTURE

## 2023-06-22 ENCOUNTER — Encounter: Payer: Self-pay | Admitting: Family Medicine

## 2023-06-22 ENCOUNTER — Ambulatory Visit (HOSPITAL_COMMUNITY): Payer: BC Managed Care – PPO

## 2023-06-22 DIAGNOSIS — M25551 Pain in right hip: Secondary | ICD-10-CM | POA: Diagnosis not present

## 2023-06-22 DIAGNOSIS — S76311A Strain of muscle, fascia and tendon of the posterior muscle group at thigh level, right thigh, initial encounter: Secondary | ICD-10-CM

## 2023-06-22 NOTE — Therapy (Signed)
OUTPATIENT PHYSICAL THERAPY LOWER EXTREMITY PROGRESS NOTE   Patient Name: [name] MRN: [id number] DOB:[birth year], 43 y.o., female Today's Date: 06/22/2023  END OF SESSION:  PT End of Session - 06/22/23 1250     Visit Number 6    Number of Visits 10    Date for PT Re-Evaluation 07/20/23    Authorization Type [employer]    Authorization Time Period no auth required    PT Start Time 1155   patient is around 10 minutes late   PT Stop Time 1230    PT Time Calculation (min) 35 min    Activity Tolerance Patient tolerated treatment well    Behavior During Therapy [facility] for tasks assessed/performed             Past Medical History:  Diagnosis Date   Hypertension    Kidney stone    Past Surgical History:  Procedure Laterality Date   TUBAL LIGATION     Patient Active Problem List   Diagnosis Date Noted   Urinary frequency 06/17/2023   Hamstring muscle strain, right, subsequent encounter 05/16/2023   Right hip pain 03/16/2023   Anxiety 01/18/2023   Hyperpigmentation of skin 12/11/2022   Neuropathy 11/02/2022   Panic attack as reaction to stress 10/15/2022   Bipolar 1 disorder (HCC) 10/15/2022   Cervical cancer screening 09/30/2022   Rash in adult 09/08/2022   Primary hypertension 09/08/2022   Nephrolithiasis 10/30/2019   Pyelonephritis 10/30/2019   Tobacco abuse 10/30/2019   Progress Note Reporting Period 05/25/23 to 06/22/23  See note below for Objective Data and Assessment of Progress/Goals.      PCP: Gilmore Laroche  REFERRING PROVIDER: Gilmore Laroche, FNP  REFERRING DIAG: [phone] (ICD-10-CM) - Hamstring muscle strain, right, subsequent encounter  THERAPY DIAG:  Pain in right hip  Strain of right hamstring, initial encounter  Rationale for Evaluation and Treatment: Rehabilitation  ONSET DATE: 05/05/2023  SUBJECTIVE:   SUBJECTIVE STATEMENT: PROGRESS NOTE 06/22/23: Patient states that she's not having pain today but was hurting after the last session.  Patient states that PT has helped her and she's around 80% better now. Patient has been doing her HEP without issues. Patient thinks that the hamstrings got better and does not need PT for it but she thinks she needs PT for the R hip. Sitting is still difficult and still bothers her hip and can only tolerate 45 min-1 hour. Walking is not an issue at the moment and can walk for > 15 minutes without pain.  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 Yes: NPRS scale: 3 Pain location: right hamstring Pain description: tight and sore Aggravating factors: walking too much, sitting too much Relieving factors: laying with leg propped  PRECAUTIONS: None     WEIGHT BEARING RESTRICTIONS: No  FALLS:  Has patient fallen in last 6 months? Yes. Number of falls 1  OCCUPATION: manager at [employer]  PLOF: Independent  PATIENT GOALS: to be 100%  NEXT MD VISIT: sees Hilda Lias on Thursday 1/16  OBJECTIVE:  Note: Objective measures were completed at Evaluation unless otherwise noted.  DIAGNOSTIC FINDINGS:  CLINICAL DATA:  Recent fall with right hip pain, initial encounter   EXAM: DG HIP (WITH OR WITHOUT PELVIS) 2v RIGHT   COMPARISON:  03/16/2023   FINDINGS: Pelvic ring is intact. No acute fracture or dislocation is noted. No soft tissue abnormality is seen.   IMPRESSION: No acute abnormality noted.  PATIENT SURVEYS:  LEFS 06/22/23  67/80 = 83.8% from 37/80 46.3%  COGNITION: Overall cognitive status: Within functional limits for tasks assessed     SENSATION: WFL  EDEMA:  Not currently but did have swelling initially  MUSCLE LENGTH: Hamstrings: Right 06/22/23: 60 deg from 38 deg; Left 70 deg AAROM  POSTURE: No Significant postural limitations  PALPATION: Tender origin and insertion areas  LOWER EXTREMITY ROM:  Active ROM Right eval Left eval Right 06/22/23 Left 06/22/23  Hip flexion   [facility]  Hip extension   [facility]  Hip abduction    [facility]  Hip adduction      Hip internal rotation   30 30  Hip external rotation   30 40  Knee flexion 110 122 135 WFL  Knee extension      Ankle dorsiflexion      Ankle plantarflexion      Ankle inversion      Ankle eversion       (Blank rows = not tested)  LOWER EXTREMITY MMT:  MMT Right eval Left eval Right 06/22/23 Left 06/22/23  Hip flexion   5 5  Hip extension   3+ 3+  Hip abduction   4- 4-  Hip adduction      Hip internal rotation      Hip external rotation      Knee flexion 3- 4+ 4+ 5  Knee extension   5 5  Ankle dorsiflexion   5 5  Ankle plantarflexion   5 5  Ankle inversion      Ankle eversion       (Blank rows = not tested)  FUNCTIONAL TESTS:  5 times sit to stand: 06/22/23: 8.44 sec 2 minute walk test: 06/22/23: 515 ft  GAIT: 06/22/23 Distance walked: 515 ft Assistive device utilized: None Level of assistance: Complete Independence Comments: WFL                                                                                                                                TREATMENT DATE:  06/22/23 Progress note (LEFS, 5TSTS, , MMT, ROM)  06/15/23 Recumbent bike, seat 8, level 1, 5' Gr. 2 ant hip glides in prone x 2' Bridges x 3" x 10 x 2 Bridges with hip abd x 3" x 10 x 2, RTB Seated piriformis stretch x 30" Supine piriformis stretch x 30" x 2 Plank (on elbows) x 3" x 10 SLR x 10 x 2  06/04/23 Supine: moist heat to the right hamstring to decrease soreness and increase soft tissue extensibility x 5 Towel right hamstring stretch 5 x 10" Right Figure 4 hamstring/glute stretch 5 x 10" Seated: Hamstring stretch 5 x 10" Nustep seat 8 x 5' for mobility Standing: Calf and soleus stretch 3 x 10" each Hip extension toe tap 2 x 10 Hip abduction toe tap 2 x 10 Hamstring curl 2 x 5 Mini squats 2 x 10 Sidestepping // bars down and back x 5  06/01/23 Supine: moist heat to the right hamstring to decrease soreness and increase soft tissue extensibility  x 7' Active right hamstring stretch with towel 5 x 10" each Nustep seat 7 or 8 x 5' for mobility Standing: Calf stretch against the wall gastroc then soleus 5 x 10" each Hamstring stretch on step 7";  5 x 10" Updated HEP   05/28/2023  -Seated HS stretch 3 x 30'' -Prone STM with light theragun and manual pressure at distal hamstring and proximal lateral hip musculature x 8' -Prone knee flexion x 20 with 5'' eccentric control  -Knee bent prone hip extension x 10 w/ 5' -Clamshells 20x w/ 5'' iso -Supine bridge 20x 5'' iso  05/25/23 physical therapy evaluation and HEP instruction    PATIENT EDUCATION:  Education details: Patient educated on exam findings, POC, scope of PT, HEP, and what to expect next visit. Person educated: Patient Education method: Explanation, Demonstration, and Handouts Education comprehension: verbalized understanding, returned demonstration, verbal cues required, and tactile cues required  HOME EXERCISE PROGRAM: 06/01/23 calf stretch, soleus stretch, standing hamstring stretch  Access Code: [email or web address] URL: [email or web address] Date: 05/25/2023 Prepared by: AP - Rehab  Exercises - Seated Hamstring Stretch  - 2 x daily - 7 x weekly - 1 sets - 5 reps - 20 sec hold - Seated Table Hamstring Stretch  - 2 x daily - 7 x weekly - 1 sets - 5 reps - 20 sec hold - Supine Hamstring Stretch  - 2 x daily - 7 x weekly - 1 sets - 5 reps - 20 sec hold Access Code: [email or web address] URL: [email or web address] Date: 05/28/2023 Prepared by: Starling Manns  Exercises - Prone Knee Flexion AROM  - 1 x daily - 7 x weekly - 3 sets - 10 reps - Prone Hip Extension with Bent Knee  - 1 x daily - 7 x weekly - 3 sets - 10 reps - Clamshell  - 1 x daily - 7 x weekly - 3 sets - 10 reps - Supine Bridge  - 1 x daily - 7 x weekly - 3 sets - 10 reps ASSESSMENT:  CLINICAL IMPRESSION: PROGRESS NOTE 06/22/23: Patient demonstrated continued improvements in function as  indicated by positive significant changes in strength and ROM. However, patient still presents with deficits in function as manifested by having pain in prolonged sitting. Patient also presents with significant limitation with hip ER ROM needs to be addressed in order to be functional with LE dressing. With this, skilled PT is still required to address the impairments and functional limitations listed below.   OBJECTIVE IMPAIRMENTS: decreased ROM, decreased strength, hypomobility, impaired perceived functional ability, and pain.   ACTIVITY LIMITATIONS: sitting  PARTICIPATION LIMITATIONS: driving  REHAB POTENTIAL: Good   GOALS: Goals reviewed with patient? No  SHORT TERM GOALS: Target date: 06/08/2023 patient will be independent with initial HEP Baseline: Goal status: MET  2.  Patient will self report 50% improvement to improve tolerance for functional activity  Baseline:  Goal status: MET    LONG TERM GOALS: Target date: 07/20/2023  Patient will be independent in self management strategies to improve quality of life and functional outcomes.  Baseline:  Goal status: MET  2.  Patient will self report 75% improvement to improve tolerance for functional activity  Baseline:  Goal status: MET  3.  Patient will increase right SLR to equal to left SLR Baseline: 06/22/23: 60 deg Goal status: in progress  4.  Patient will improve LEFS score by 10 points or more  to demonstrate improve perceived functional mobility Baseline: 37/80; 06/22/23: 67/80 Goal status: in progress  5.  Patient will ambulate x 15 minutes without leg pain to prepare for return to work activities Baseline:  Goal status: MET  6. Patient will be able to tolerate prolonged sitting > 1 hour with slight pain (2-3/10) to facilitate ease in travel Baseline:  Goal status: NEW GOAL  7. Patient will be able to demonstrate increase in hip ER by 5-10 degrees to facilitate ease in LE dressing Baseline: 30 deg Goal  status: NEW GOAL    PLAN:  PT FREQUENCY: 1x/week  PT DURATION: 4 weeks  PLANNED INTERVENTIONS: 97164- PT Re-evaluation, 97110-Therapeutic exercises, 97530- Therapeutic activity, 97112- Neuromuscular re-education, 97535- Self Care, 27253- Manual therapy, [phone]- Gait training, Patient/Family education, Stair training, Taping, Dry Needling, Joint mobilization,   PLAN FOR NEXT SESSION: Continue POC and may progress as tolerated with emphasis on hip ROM and core and hip strength.   Tish Frederickson. Nehal Shives, PT, DPT, OCS Board-Certified Clinical Specialist in Orthopedic PT PT Compact Privilege # ([place]): [id number] T  12:52 PM, 06/22/23

## 2023-06-23 ENCOUNTER — Encounter: Payer: Self-pay | Admitting: Orthopaedic Surgery

## 2023-06-23 ENCOUNTER — Telehealth: Payer: Self-pay | Admitting: Orthopaedic Surgery

## 2023-06-23 ENCOUNTER — Other Ambulatory Visit: Payer: Self-pay | Admitting: Urology

## 2023-06-23 DIAGNOSIS — N200 Calculus of kidney: Secondary | ICD-10-CM

## 2023-06-23 NOTE — Progress Notes (Deleted)
 Name: [name] DOB: 10/09/1980 MRN: [id number]  History of Present Illness: [name] is a 43 y.o. female who presents today for follow up visit at [facility]. ***She is accompanied by ***. - GU History: 1. Kidney stones. 2. Prior episode of pyelonephritis.  At last visit with Dr. Retta Diones on 04/13/2023: - CT stone from 04/08/2023 had shown a 3 mm distal right ureteral stone just proximal to the UVJ, a non-obstructing 4 mm left intrarenal stone, and a duplicated left renal collecting system proximally. - Minimally symptomatic; right ureteral stone expected to pass with Flomax.   Since last visit: ***  Today: KUB today: Awaiting radiology read; *** appreciated per provider interpretation.  She {Actions; denies-reports:120008} recent stone passage. She {Actions; denies-reports:120008} flank pain or abdominal pain. She {Actions; denies-reports:120008} fevers, nausea, or vomiting.  She {Actions; denies-reports:120008} increased urinary urgency, frequency, nocturia, dysuria, gross hematuria, hesitancy, straining to void, or sensations of incomplete emptying.   Fall Screening: Do you usually have a device to assist in your mobility? {yes/no:20286}  ***cane / ***walker / ***wheelchair  Medications: Current Outpatient Medications  Medication Sig Dispense Refill   acetaminophen (TYLENOL) 500 MG tablet Take 500 mg by mouth every 6 (six) hours as needed for headache.     amLODipine (NORVASC) 2.5 MG tablet Take 1 tablet (2.5 mg total) by mouth daily. 30 tablet 2   cephALEXin (KEFLEX) 500 MG capsule Take 1 capsule (500 mg total) by mouth 4 (four) times daily. (Patient not taking: Reported on 06/17/2023) 28 capsule 0   cyclobenzaprine (FLEXERIL) 5 MG tablet Take 1 tablet (5 mg total) by mouth 3 (three) times daily as needed for muscle spasms. (Patient not taking: Reported on 06/17/2023) 30 tablet 0   ELDERBERRY PO Take by mouth. (Patient not taking: Reported on  06/17/2023)     olmesartan (BENICAR) 20 MG tablet Take 1 tablet by mouth once daily 90 tablet 0   tretinoin (RETIN-A) 0.025 % cream Apply topically at bedtime. 45 g 0   Vitamin D, Ergocalciferol, (DRISDOL) 1.25 MG (50000 UNIT) CAPS capsule Take 1 capsule (50,000 Units total) by mouth every 7 (seven) days. (Patient not taking: Reported on 06/17/2023) 20 capsule 1   No current facility-administered medications for this visit.    Allergies: No Known Allergies  Past Medical History:  Diagnosis Date   Hypertension    Kidney stone    Past Surgical History:  Procedure Laterality Date   TUBAL LIGATION     Family History  Problem Relation Age of Onset   Breast cancer Maternal Aunt    Breast cancer Cousin    Social History   Socioeconomic History   Marital status: Married    Spouse name: Not on file   Number of children: Not on file   Years of education: Not on file   Highest education level: Not on file  Occupational History   Not on file  Tobacco Use   Smoking status: Every Day    Current packs/day: 0.25    Types: Cigarettes   Smokeless tobacco: Never   Tobacco comments:    6 a day  Vaping Use   Vaping status: Former  Substance and Sexual Activity   Alcohol use: Yes    Alcohol/week: 5.0 standard drinks of alcohol    Types: 5 Shots of liquor per week    Comment: daily   Drug use: Not Currently    Types: Marijuana    Comment: occ; last use a few days ago.  Sexual activity: Yes    Birth control/protection: Surgical  Other Topics Concern   Not on file  Social History Narrative   Not on file   Social Drivers of Health   Financial Resource Strain: Not on file  Food Insecurity: Not on file  Transportation Needs: Not on file  Physical Activity: Not on file  Stress: Not on file  Social Connections: Not on file  Intimate Partner Violence: Not on file    SUBJECTIVE  Review of Systems Constitutional: Patient denies any unintentional weight loss or change in  strength lntegumentary: Patient denies any rashes or pruritus Cardiovascular: Patient denies chest pain or syncope Respiratory: Patient denies shortness of breath Gastrointestinal: Patient ***denies nausea, vomiting, constipation, or diarrhea Musculoskeletal: Patient denies muscle cramps or weakness Neurologic: Patient denies convulsions or seizures Allergic/Immunologic: Patient denies recent allergic reaction(s) Hematologic/Lymphatic: Patient denies bleeding tendencies Endocrine: Patient denies heat/cold intolerance  GU: As per HPI.  OBJECTIVE There were no vitals filed for this visit. There is no height or weight on file to calculate BMI.  Physical Examination Constitutional: No obvious distress; patient is non-toxic appearing  Cardiovascular: No visible lower extremity edema.  Respiratory: The patient does not have audible wheezing/stridor; respirations do not appear labored  Gastrointestinal: Abdomen non-distended Musculoskeletal: Normal ROM of UEs  Skin: No obvious rashes/open sores  Neurologic: CN 2-12 grossly intact Psychiatric: Answered questions appropriately with normal affect  Hematologic/Lymphatic/Immunologic: No obvious bruises or sites of spontaneous bleeding  UA:  ***positive for *** leukocytes, *** blood, ***nitrites ***Urine microscopy:  ***negative  *** WBC/hpf, *** RBC/hpf, *** bacteria ***with no evidence of UTI ***with no evidence of microscopic hematuria ***otherwise unremarkable ***glucosuria (secondary to ***Jardiance ***Farxiga use)  PVR: *** ml  ASSESSMENT No diagnosis found.  ***We reviewed recent imaging results; ***awaiting radiology results, appears to have ***no acute findings per provider interpretation.  ***For stone prevention: Advised adequate hydration and we discussed option to consider low oxalate diet given that calcium oxalate is the most common type of stone. Handout provided about stone prevention diet.  ***For recurrent stone  formers: We discussed option to proceed with 24 hour urinalysis ([facility]) for metabolic stone evaluation, which may help with targeted recommendations for dietary I medication therapies for stone prevention. Patient elected to ***proceed/ ***hold off.  Will plan to follow up in ***6 months / ***1 year with ***KUB ***RUS for stone surveillance or sooner if needed.  Patient verbalized understanding of and agreement with current plan. All questions were answered.  PLAN Advised the following: Maintain adequate fluid intake daily. Drink citrus juice (lemon, lime or orange juice) routinely. Low oxalate diet. No follow-ups on file.  No orders of the defined types were placed in this encounter.   It has been explained that the patient is to follow regularly with their PCP in addition to all other providers involved in their care and to follow instructions provided by these respective offices. Patient advised to contact urology clinic if any urologic-pertaining questions, concerns, new symptoms or problems arise in the interim period.  There are no Patient Instructions on file for this visit.  Electronically signed by:  Donnita Falls, MSN, FNP-C, CUNP 06/23/2023 8:56 AM

## 2023-06-23 NOTE — Telephone Encounter (Signed)
Dr. Sanjuan Dame pt - spoke w/the pt, she stated that her employer is not accommodating her work restrictions so she is requesting a note to be OOW until 07/10/22.  Please advise.

## 2023-06-23 NOTE — Telephone Encounter (Signed)
LVM that the OOW number will be at the front desk for patient to pick up

## 2023-06-24 ENCOUNTER — Ambulatory Visit: Payer: BC Managed Care – PPO | Admitting: Urology

## 2023-06-24 ENCOUNTER — Encounter (HOSPITAL_COMMUNITY): Payer: BC Managed Care – PPO

## 2023-06-28 ENCOUNTER — Ambulatory Visit: Payer: BC Managed Care – PPO | Admitting: Urology

## 2023-06-29 ENCOUNTER — Encounter (HOSPITAL_COMMUNITY): Payer: BC Managed Care – PPO

## 2023-06-29 ENCOUNTER — Telehealth (HOSPITAL_COMMUNITY): Payer: Self-pay

## 2023-06-29 NOTE — Telephone Encounter (Signed)
 No show #2. Called and spoke to pt who thought her next apt was Thursday. Reviewed next apt date and time and educated on no show policy details.  Becky Sax, LPTA/CLT; [place] [phone]

## 2023-07-07 ENCOUNTER — Encounter (HOSPITAL_COMMUNITY): Payer: BC Managed Care – PPO | Admitting: Physical Therapy

## 2023-07-08 ENCOUNTER — Ambulatory Visit (HOSPITAL_COMMUNITY): Payer: BC Managed Care – PPO

## 2023-07-08 ENCOUNTER — Ambulatory Visit: Payer: BC Managed Care – PPO | Admitting: Orthopaedic Surgery

## 2023-07-08 ENCOUNTER — Encounter: Payer: Self-pay | Admitting: Orthopaedic Surgery

## 2023-07-08 VITALS — BP 117/79 | HR 99 | Ht 66.0 in | Wt 190.0 lb

## 2023-07-08 DIAGNOSIS — S76311A Strain of muscle, fascia and tendon of the posterior muscle group at thigh level, right thigh, initial encounter: Secondary | ICD-10-CM | POA: Diagnosis not present

## 2023-07-08 DIAGNOSIS — M25551 Pain in right hip: Secondary | ICD-10-CM

## 2023-07-08 DIAGNOSIS — S76311D Strain of muscle, fascia and tendon of the posterior muscle group at thigh level, right thigh, subsequent encounter: Secondary | ICD-10-CM | POA: Diagnosis not present

## 2023-07-08 NOTE — Progress Notes (Signed)
 I feel better.  Her hamstring strain is resolving.  She is able to get around well.  She is walking more.  She has no new trauma.  ROM of the right hip and leg is good.  NV intact.  Encounter Diagnosis  Name Primary?   Hamstring muscle strain, right, subsequent encounter Yes   She can return to work with no restrictions as of July 12, 2023.  I have completed forms for her.  I will see as needed.  Call if any problem.  Precautions discussed.  Electronically Signed Darreld Mclean, MD 2/27/20258:38 AM

## 2023-07-08 NOTE — Therapy (Signed)
 OUTPATIENT PHYSICAL THERAPY LOWER EXTREMITY TREATMENT NOTE   Patient Name: [name] MRN: [id number] DOB:June 20, 1980, 43 y.o., female Today's Date: 07/08/2023  END OF SESSION:  PT End of Session - 07/08/23 1304     Visit Number 7    Number of Visits 10    Date for PT Re-Evaluation 07/20/23    Authorization Type BCBS    Authorization Time Period no auth required    PT Start Time 1150    PT Stop Time 1230    PT Time Calculation (min) 40 min    Activity Tolerance Patient tolerated treatment well    Behavior During Therapy [facility] for tasks assessed/performed              Past Medical History:  Diagnosis Date   Hypertension    Kidney stone    Past Surgical History:  Procedure Laterality Date   TUBAL LIGATION     Patient Active Problem List   Diagnosis Date Noted   Urinary frequency 06/17/2023   Hamstring muscle strain, right, subsequent encounter 05/16/2023   Right hip pain 03/16/2023   Anxiety 01/18/2023   Hyperpigmentation of skin 12/11/2022   Neuropathy 11/02/2022   Panic attack as reaction to stress 10/15/2022   Bipolar 1 disorder (HCC) 10/15/2022   Cervical cancer screening 09/30/2022   Rash in adult 09/08/2022   Primary hypertension 09/08/2022   Nephrolithiasis 10/30/2019   Pyelonephritis 10/30/2019   Tobacco abuse 10/30/2019   PCP: Gilmore Laroche  REFERRING PROVIDER: Gilmore Laroche, FNP  REFERRING DIAG: [phone] (ICD-10-CM) - Hamstring muscle strain, right, subsequent encounter  THERAPY DIAG:  Pain in right hip  Strain of right hamstring, initial encounter  Rationale for Evaluation and Treatment: Rehabilitation  ONSET DATE: 05/05/2023  SUBJECTIVE:   SUBJECTIVE STATEMENT: Doing well today. Denies pain. However, patient reports that hip is still stiff. Patient reports that her physician has released her to go back to work next month.  PROGRESS NOTE 06/22/23: Patient states that she's not having pain today but was hurting after the last  session. Patient states that PT has helped her and she's around 80% better now. Patient has been doing her HEP without issues. Patient thinks that the hamstrings got better and does not need PT for it but she thinks she needs PT for the R hip. Sitting is still difficult and still bothers her hip and can only tolerate 45 min-1 hour. Walking is not an issue at the moment and can walk for > 15 minutes without pain.  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 Yes: NPRS scale: 3 Pain location: right hamstring Pain description: tight and sore Aggravating factors: walking too much, sitting too much Relieving factors: laying with leg propped  PRECAUTIONS: None     WEIGHT BEARING RESTRICTIONS: No  FALLS:  Has patient fallen in last 6 months? Yes. Number of falls 1  OCCUPATION: manager at [employer]  PLOF: Independent  PATIENT GOALS: to be 100%  NEXT MD VISIT: sees Hilda Lias on Thursday 1/16  OBJECTIVE:  Note: Objective measures were completed at Evaluation unless otherwise noted.  DIAGNOSTIC FINDINGS:  CLINICAL DATA:  Recent fall with right hip pain, initial encounter   EXAM: DG HIP (WITH OR WITHOUT PELVIS) 2v RIGHT   COMPARISON:  03/16/2023   FINDINGS: Pelvic ring is intact. No acute fracture or dislocation is noted. No soft tissue abnormality is seen.   IMPRESSION: No acute abnormality noted.  PATIENT SURVEYS:  LEFS 06/22/23  67/80 = 83.8% from 37/80 46.3%  COGNITION: Overall cognitive status: Within functional limits for tasks assessed     SENSATION: WFL  EDEMA:  Not currently but did have swelling initially  MUSCLE LENGTH: Hamstrings: Right 06/22/23: 60 deg from 38 deg; Left 70 deg AAROM  POSTURE: No Significant postural limitations  PALPATION: Tender origin and insertion areas  LOWER EXTREMITY ROM:  Active ROM Right eval Left eval Right 06/22/23 Left 06/22/23  Hip flexion   [facility]  Hip extension   [facility]  Hip  abduction   [facility]  Hip adduction      Hip internal rotation   30 30  Hip external rotation   30 40  Knee flexion 110 122 135 WFL  Knee extension      Ankle dorsiflexion      Ankle plantarflexion      Ankle inversion      Ankle eversion       (Blank rows = not tested)  LOWER EXTREMITY MMT:  MMT Right eval Left eval Right 06/22/23 Left 06/22/23  Hip flexion   5 5  Hip extension   3+ 3+  Hip abduction   4- 4-  Hip adduction      Hip internal rotation      Hip external rotation      Knee flexion 3- 4+ 4+ 5  Knee extension   5 5  Ankle dorsiflexion   5 5  Ankle plantarflexion   5 5  Ankle inversion      Ankle eversion       (Blank rows = not tested)  FUNCTIONAL TESTS:  5 times sit to stand: 06/22/23: 8.44 sec 2 minute walk test: 06/22/23: 515 ft  GAIT: 06/22/23 Distance walked: 515 ft Assistive device utilized: None Level of assistance: Complete Independence Comments: WFL                                                                                                                                TREATMENT DATE:  07/08/23 Recumbent bike, seat 8, level 1, 5' Self-anterior R hip mob with a belt x 3" x 10 Self-lat distraction R hip mob with a belt x 20 x 2 Seated R piriformis stretch x 30" Seated hamstring stretch x 30" x 3 Push/pull a sled x 40 lbs x 4 rounds  06/22/23 Progress note (LEFS, 5TSTS, , MMT, ROM)  06/15/23 Recumbent bike, seat 8, level 1, 5' Gr. 2 ant hip glides in prone x 2' Bridges x 3" x 10 x 2 Bridges with hip abd x 3" x 10 x 2, RTB Seated piriformis stretch x 30" Supine piriformis stretch x 30" x 2 Plank (on elbows) x 3" x 10 SLR x 10 x 2  06/04/23 Supine: moist heat to the right hamstring to decrease soreness and increase soft tissue extensibility x 5 Towel right hamstring stretch 5 x 10" Right Figure 4 hamstring/glute stretch 5 x 10"  Seated: Hamstring stretch 5 x 10" Nustep seat 8 x 5' for mobility Standing: Calf and soleus stretch 3  x 10" each Hip extension toe tap 2 x 10 Hip abduction toe tap 2 x 10 Hamstring curl 2 x 5 Mini squats 2 x 10 Sidestepping // bars down and back x 5  06/01/23 Supine: moist heat to the right hamstring to decrease soreness and increase soft tissue extensibility x 7' Active right hamstring stretch with towel 5 x 10" each Nustep seat 7 or 8 x 5' for mobility Standing: Calf stretch against the wall gastroc then soleus 5 x 10" each Hamstring stretch on step 7";  5 x 10" Updated HEP   05/28/2023  -Seated HS stretch 3 x 30'' -Prone STM with light theragun and manual pressure at distal hamstring and proximal lateral hip musculature x 8' -Prone knee flexion x 20 with 5'' eccentric control  -Knee bent prone hip extension x 10 w/ 5' -Clamshells 20x w/ 5'' iso -Supine bridge 20x 5'' iso  05/25/23 physical therapy evaluation and HEP instruction    PATIENT EDUCATION:  Education details: Patient educated on exam findings, POC, scope of PT, HEP, and what to expect next visit. Person educated: Patient Education method: Explanation, Demonstration, and Handouts Education comprehension: verbalized understanding, returned demonstration, verbal cues required, and tactile cues required  HOME EXERCISE PROGRAM: 07/08/23 - Seated Table Piriformis Stretch  - 1 x daily - 7 x weekly - 3 reps - 30 hold 06/01/23 calf stretch, soleus stretch, standing hamstring stretch  Access Code: [email or web address] URL: [email or web address] Date: 05/25/2023 Prepared by: [facility] - Rehab  Exercises - Seated Hamstring Stretch  - 2 x daily - 7 x weekly - 1 sets - 5 reps - 20 sec hold - Seated Table Hamstring Stretch  - 2 x daily - 7 x weekly - 1 sets - 5 reps - 20 sec hold - Supine Hamstring Stretch  - 2 x daily - 7 x weekly - 1 sets - 5 reps - 20 sec hold Access Code: [email or web address] URL: [email or web address] Date: 05/28/2023 Prepared by: Starling Manns  Exercises - Prone Knee Flexion AROM  - 1 x daily - 7 x  weekly - 3 sets - 10 reps - Prone Hip Extension with Bent Knee  - 1 x daily - 7 x weekly - 3 sets - 10 reps - Clamshell  - 1 x daily - 7 x weekly - 3 sets - 10 reps - Supine Bridge  - 1 x daily - 7 x weekly - 3 sets - 10 reps ASSESSMENT:  CLINICAL IMPRESSION: Interventions today were geared towards hip mobility and functional strengthening. Tolerated all activities without worsening of symptoms. Demonstrated appropriate levels of fatigue. Provided mild amount of cueing to ensure correct execution of activity with good carry-over. To date, skilled PT is required to address the impairments and improve function.  PROGRESS NOTE 06/22/23: Patient demonstrated continued improvements in function as indicated by positive significant changes in strength and ROM. However, patient still presents with deficits in function as manifested by having pain in prolonged sitting. Patient also presents with significant limitation with hip ER ROM needs to be addressed in order to be functional with LE dressing. With this, skilled PT is still required to address the impairments and functional limitations listed below.   OBJECTIVE IMPAIRMENTS: decreased ROM, decreased strength, hypomobility, impaired perceived functional ability, and pain.   ACTIVITY LIMITATIONS: sitting  PARTICIPATION LIMITATIONS: driving  REHAB POTENTIAL: Good   GOALS: Goals reviewed with patient? No  SHORT TERM GOALS: Target date: 06/08/2023 patient will be independent with initial HEP Baseline: Goal status: MET  2.  Patient will self report 50% improvement to improve tolerance for functional activity  Baseline:  Goal status: MET    LONG TERM GOALS: Target date: 07/20/2023  Patient will be independent in self management strategies to improve quality of life and functional outcomes.  Baseline:  Goal status: MET  2.  Patient will self report 75% improvement to improve tolerance for functional activity  Baseline:  Goal status:  MET  3.  Patient will increase right SLR to equal to left SLR Baseline: 06/22/23: 60 deg Goal status: in progress  4.  Patient will improve LEFS score by 10 points or more to demonstrate improve perceived functional mobility Baseline: 37/80; 06/22/23: 67/80 Goal status: in progress  5.  Patient will ambulate x 15 minutes without leg pain to prepare for return to work activities Baseline:  Goal status: MET  6. Patient will be able to tolerate prolonged sitting > 1 hour with slight pain (2-3/10) to facilitate ease in travel Baseline:  Goal status: NEW GOAL  7. Patient will be able to demonstrate increase in hip ER by 5-10 degrees to facilitate ease in LE dressing Baseline: 30 deg Goal status: NEW GOAL    PLAN:  PT FREQUENCY: 1x/week  PT DURATION: 4 weeks  PLANNED INTERVENTIONS: 97164- PT Re-evaluation, 97110-Therapeutic exercises, 97530- Therapeutic activity, 97112- Neuromuscular re-education, 97535- Self Care, 82956- Manual therapy, [phone]- Gait training, Patient/Family education, Stair training, Taping, Dry Needling, Joint mobilization,   PLAN FOR NEXT SESSION: Continue POC and may progress as tolerated with emphasis on hip ROM and core and hip strength.   Tish Frederickson. Malaky Tetrault, PT, DPT, OCS Board-Certified Clinical Specialist in Orthopedic PT PT Compact Privilege # ([place]): [phone] T 1:05 PM, 07/08/23

## 2023-07-08 NOTE — Patient Instructions (Addendum)
 Return to work March 4th  Steps to Quit Smoking Smoking tobacco is the leading cause of preventable death. It can affect almost every organ in the body. Smoking puts you and people around you at risk for many serious, long-lasting (chronic) diseases. Quitting smoking can be hard, but it is one of the best things that you can do for your health. It is never too late to quit. Do not give up if you cannot quit the first time. Some people need to try many times to quit. Do your best to stick to your quit plan, and talk with your doctor if you have any questions or concerns. How do I get ready to quit? Pick a date to quit. Set a date within the next 2 weeks to give you time to prepare. Write down the reasons why you are quitting. Keep this list in places where you will see it often. Tell your family, friends, and co-workers that you are quitting. Their support is important. Talk with your doctor about the choices that may help you quit. Find out if your health insurance will pay for these treatments. Know the people, places, things, and activities that make you want to smoke (triggers). Avoid them. What first steps can I take to quit smoking? Throw away all cigarettes at home, at work, and in your car. Throw away the things that you use when you smoke, such as ashtrays and lighters. Clean your car. Empty the ashtray. Clean your home, including curtains and carpets. What can I do to help me quit smoking? Talk with your doctor about taking medicines and seeing a counselor. You are more likely to succeed when you do both. If you are pregnant or breastfeeding: Talk with your doctor about counseling or other ways to quit smoking. Do not take medicine to help you quit smoking unless your doctor tells you to. Quit right away Quit smoking completely, instead of slowly cutting back on how much you smoke over a period of time. Stopping smoking right away may be more successful than slowly quitting. Go to  counseling. In-person is best if this is an option. You are more likely to quit if you go to counseling sessions regularly. Take medicine You may take medicines to help you quit. Some medicines need a prescription, and some you can buy over-the-counter. Some medicines may contain a drug called nicotine to replace the nicotine in cigarettes. Medicines may: Help you stop having the desire to smoke (cravings). Help to stop the problems that come when you stop smoking (withdrawal symptoms). Your doctor may ask you to use: Nicotine patches, gum, or lozenges. Nicotine inhalers or sprays. Non-nicotine medicine that you take by mouth. Find resources Find resources and other ways to help you quit smoking and remain smoke-free after you quit. They include: Online chats with a Veterinary surgeon. Phone quitlines. Printed Materials engineer. Support groups or group counseling. Text messaging programs. Mobile phone apps. Use apps on your mobile phone or tablet that can help you stick to your quit plan. Examples of free services include Quit Guide from the CDC and smokefree.gov  What can I do to make it easier to quit?  Talk to your family and friends. Ask them to support and encourage you. Call a phone quitline, such as 1-800-QUIT-NOW, reach out to support groups, or work with a Veterinary surgeon. Ask people who smoke to not smoke around you. Avoid places that make you want to smoke, such as: Bars. Parties. Smoke-break areas at work. Spend time with  people who do not smoke. Lower the stress in your life. Stress can make you want to smoke. Try these things to lower stress: Getting regular exercise. Doing deep-breathing exercises. Doing yoga. Meditating. What benefits will I see if I quit smoking? Over time, you may have: A better sense of smell and taste. Less coughing and sore throat. A slower heart rate. Lower blood pressure. Clearer skin. Better breathing. Fewer sick days. Summary Quitting smoking  can be hard, but it is one of the best things that you can do for your health. Do not give up if you cannot quit the first time. Some people need to try many times to quit. When you decide to quit smoking, make a plan to help you succeed. Quit smoking right away, not slowly over a period of time. When you start quitting, get help and support to keep you smoke-free. This information is not intended to replace advice given to you by your health care provider. Make sure you discuss any questions you have with your health care provider. Document Revised: 04/18/2021 Document Reviewed: 04/18/2021 Elsevier Patient Education  2024 [employer].

## 2023-07-14 ENCOUNTER — Encounter (HOSPITAL_COMMUNITY): Payer: BC Managed Care – PPO

## 2023-07-15 ENCOUNTER — Encounter (HOSPITAL_COMMUNITY): Payer: BC Managed Care – PPO

## 2023-07-15 ENCOUNTER — Ambulatory Visit (HOSPITAL_COMMUNITY): Attending: Family Medicine

## 2023-07-15 ENCOUNTER — Encounter (HOSPITAL_COMMUNITY): Payer: Self-pay

## 2023-07-15 DIAGNOSIS — M25551 Pain in right hip: Secondary | ICD-10-CM | POA: Diagnosis not present

## 2023-07-15 DIAGNOSIS — S76311A Strain of muscle, fascia and tendon of the posterior muscle group at thigh level, right thigh, initial encounter: Secondary | ICD-10-CM | POA: Diagnosis not present

## 2023-07-15 NOTE — Therapy (Signed)
 OUTPATIENT PHYSICAL THERAPY LOWER EXTREMITY TREATMENT NOTE   Patient Name: [name] MRN: [id number] DOB:25-Nov-1980, 43 y.o., female Today's Date: 07/15/2023  END OF SESSION:  PT End of Session - 07/15/23 1144     Visit Number 8    Number of Visits 10    Date for PT Re-Evaluation 07/20/23    Authorization Type BCBS    Progress Note Due on Visit 10    PT Start Time 1107    PT Stop Time 1143    PT Time Calculation (min) 36 min    Activity Tolerance Patient tolerated treatment well    Behavior During Therapy [facility] for tasks assessed/performed               Past Medical History:  Diagnosis Date   Hypertension    Kidney stone    Past Surgical History:  Procedure Laterality Date   TUBAL LIGATION     Patient Active Problem List   Diagnosis Date Noted   Urinary frequency 06/17/2023   Hamstring muscle strain, right, subsequent encounter 05/16/2023   Right hip pain 03/16/2023   Anxiety 01/18/2023   Hyperpigmentation of skin 12/11/2022   Neuropathy 11/02/2022   Panic attack as reaction to stress 10/15/2022   Bipolar 1 disorder (HCC) 10/15/2022   Cervical cancer screening 09/30/2022   Rash in adult 09/08/2022   Primary hypertension 09/08/2022   Nephrolithiasis 10/30/2019   Pyelonephritis 10/30/2019   Tobacco abuse 10/30/2019   PCP: Gilmore Laroche  REFERRING PROVIDER: Gilmore Laroche, FNP  REFERRING DIAG: [phone] (ICD-10-CM) - Hamstring muscle strain, right, subsequent encounter  THERAPY DIAG:  Pain in right hip  Strain of right hamstring, initial encounter  Rationale for Evaluation and Treatment: Rehabilitation  ONSET DATE: 05/05/2023  SUBJECTIVE:   SUBJECTIVE STATEMENT: Pt reporting no pain but soreness from going back to work. Pt reports issues with self mobilizations at home and reports increased onset of back pain.   PROGRESS NOTE 06/22/23: Patient states that she's not having pain today but was hurting after the last session. Patient states that  PT has helped her and she's around 80% better now. Patient has been doing her HEP without issues. Patient thinks that the hamstrings got better and does not need PT for it but she thinks she needs PT for the R hip. Sitting is still difficult and still bothers her hip and can only tolerate 45 min-1 hour. Walking is not an issue at the moment and can walk for > 15 minutes without pain.  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 Yes: NPRS scale: 3 Pain location: right hamstring Pain description: tight and sore Aggravating factors: walking too much, sitting too much Relieving factors: laying with leg propped  PRECAUTIONS: None     WEIGHT BEARING RESTRICTIONS: No  FALLS:  Has patient fallen in last 6 months? Yes. Number of falls 1  OCCUPATION: manager at [employer]  PLOF: Independent  PATIENT GOALS: to be 100%  NEXT MD VISIT: sees Hilda Lias on Thursday 1/16  OBJECTIVE:  Note: Objective measures were completed at Evaluation unless otherwise noted.  DIAGNOSTIC FINDINGS:  CLINICAL DATA:  Recent fall with right hip pain, initial encounter   EXAM: DG HIP (WITH OR WITHOUT PELVIS) 2v RIGHT   COMPARISON:  03/16/2023   FINDINGS: Pelvic ring is intact. No acute fracture or dislocation is noted. No soft tissue abnormality is seen.   IMPRESSION: No acute abnormality noted.  PATIENT SURVEYS:  LEFS 06/22/23  67/80 = 83.8% from 37/80 46.3%  COGNITION: Overall cognitive status: Within functional limits for tasks assessed     SENSATION: WFL  EDEMA:  Not currently but did have swelling initially  MUSCLE LENGTH: Hamstrings: Right 06/22/23: 60 deg from 38 deg; Left 70 deg AAROM  POSTURE: No Significant postural limitations  PALPATION: Tender origin and insertion areas  LOWER EXTREMITY ROM:  Active ROM Right eval Left eval Right 06/22/23 Left 06/22/23  Hip flexion   [facility]  Hip extension   [facility]  Hip abduction   [facility]  Hip  adduction      Hip internal rotation   30 30  Hip external rotation   30 40  Knee flexion 110 122 135 WFL  Knee extension      Ankle dorsiflexion      Ankle plantarflexion      Ankle inversion      Ankle eversion       (Blank rows = not tested)  LOWER EXTREMITY MMT:  MMT Right eval Left eval Right 06/22/23 Left 06/22/23  Hip flexion   5 5  Hip extension   3+ 3+  Hip abduction   4- 4-  Hip adduction      Hip internal rotation      Hip external rotation      Knee flexion 3- 4+ 4+ 5  Knee extension   5 5  Ankle dorsiflexion   5 5  Ankle plantarflexion   5 5  Ankle inversion      Ankle eversion       (Blank rows = not tested)  FUNCTIONAL TESTS:  5 times sit to stand: 06/22/23: 8.44 sec 2 minute walk test: 06/22/23: 515 ft  GAIT: 06/22/23 Distance walked: 515 ft Assistive device utilized: None Level of assistance: Complete Independence Comments: WFL                                                                                                                                TREATMENT DATE:  07/15/2023  -Seated Piriformis stretch 2 x 1' -Modified criss cross sitting with BLE on box to alleviate hip flexion and pt provided passive abduction force at knees with cues for anterior trunk lean 3 x 30'  -Long axis distraction @ R hip x 10 for 5 second holds -Supine lateral distraction mobilization 10 x 5 -Quadruped fire hydrants 3 x 10 -Standing hip adduction stretch with posterolateral lunge with BUE support @ // bars x 10 bilaterally -Modified lunge with L knee on 6in box and pad x 5 in // bars  07/08/23 Recumbent bike, seat 8, level 1, 5' Self-anterior R hip mob with a belt x 3" x 10 Self-lat distraction R hip mob with a belt x 20 x 2 Seated R piriformis stretch x 30" Seated hamstring stretch x 30" x 3 Push/pull a sled x 40 lbs x 4 rounds  06/22/23 Progress note (LEFS, 5TSTS, , MMT,  ROM)    PATIENT EDUCATION:  Education details: Patient educated on exam findings,  POC, scope of PT, HEP, and what to expect next visit. Person educated: Patient Education method: Explanation, Demonstration, and Handouts Education comprehension: verbalized understanding, returned demonstration, verbal cues required, and tactile cues required  HOME EXERCISE PROGRAM: 07/08/23 - Seated Table Piriformis Stretch  - 1 x daily - 7 x weekly - 3 reps - 30 hold 06/01/23 calf stretch, soleus stretch, standing hamstring stretch  Access Code: [email or web address] URL: [email or web address] Date: 05/25/2023 Prepared by: AP - Rehab  Exercises - Seated Hamstring Stretch  - 2 x daily - 7 x weekly - 1 sets - 5 reps - 20 sec hold - Seated Table Hamstring Stretch  - 2 x daily - 7 x weekly - 1 sets - 5 reps - 20 sec hold - Supine Hamstring Stretch  - 2 x daily - 7 x weekly - 1 sets - 5 reps - 20 sec hold Access Code: [email or web address] URL: [email or web address] Date: 05/28/2023 Prepared by: Starling Manns  Exercises - Prone Knee Flexion AROM  - 1 x daily - 7 x weekly - 3 sets - 10 reps - Prone Hip Extension with Bent Knee  - 1 x daily - 7 x weekly - 3 sets - 10 reps - Clamshell  - 1 x daily - 7 x weekly - 3 sets - 10 reps - Supine Bridge  - 1 x daily - 7 x weekly - 3 sets - 10 reps ASSESSMENT:  CLINICAL IMPRESSION: Pt tolerating session well. Reports easy fatigue at work and educated on importance of maintenance of strengthening, mobility as well as continued work to promote adequate return to work conditioning. Continued to address R hip external rotation deficits with mobilization, stretching and active strengthening, tolerated ADL activity well to replicate work with reduced depth level. Cues given for proper movement pattern as pt shows natural adduction during modified lunge activity. To date, skilled PT is required to address the impairments and improve function.   OBJECTIVE IMPAIRMENTS: decreased ROM, decreased strength, hypomobility, impaired perceived functional  ability, and pain.   ACTIVITY LIMITATIONS: sitting  PARTICIPATION LIMITATIONS: driving  REHAB POTENTIAL: Good   GOALS: Goals reviewed with patient? No  SHORT TERM GOALS: Target date: 06/08/2023 patient will be independent with initial HEP Baseline: Goal status: MET  2.  Patient will self report 50% improvement to improve tolerance for functional activity  Baseline:  Goal status: MET    LONG TERM GOALS: Target date: 07/20/2023  Patient will be independent in self management strategies to improve quality of life and functional outcomes.  Baseline:  Goal status: MET  2.  Patient will self report 75% improvement to improve tolerance for functional activity  Baseline:  Goal status: MET  3.  Patient will increase right SLR to equal to left SLR Baseline: 06/22/23: 60 deg Goal status: in progress  4.  Patient will improve LEFS score by 10 points or more to demonstrate improve perceived functional mobility Baseline: 37/80; 06/22/23: 67/80 Goal status: in progress  5.  Patient will ambulate x 15 minutes without leg pain to prepare for return to work activities Baseline:  Goal status: MET  6. Patient will be able to tolerate prolonged sitting > 1 hour with slight pain (2-3/10) to facilitate ease in travel Baseline:  Goal status: NEW GOAL  7. Patient will be able to demonstrate increase in hip ER by 5-10 degrees to facilitate ease in LE dressing Baseline: 30 deg Goal status: NEW GOAL  PLAN:  PT FREQUENCY: 1x/week  PT DURATION: 4 weeks  PLANNED INTERVENTIONS: 97164- PT Re-evaluation, 97110-Therapeutic exercises, 97530- Therapeutic activity, 97112- Neuromuscular re-education, 97535- Self Care, 13086- Manual therapy, [phone]- Gait training, Patient/Family education, Stair training, Taping, Dry Needling, Joint mobilization,   PLAN FOR NEXT SESSION: Continue POC and may progress as tolerated with emphasis on hip ROM and core and hip strength.   Nelida Meuse PT,  DPT [facility] [phone] office 11:45 AM, 07/15/23

## 2023-07-20 ENCOUNTER — Encounter (HOSPITAL_COMMUNITY): Payer: BC Managed Care – PPO

## 2023-07-21 ENCOUNTER — Other Ambulatory Visit: Payer: Self-pay | Admitting: Family Medicine

## 2023-07-21 ENCOUNTER — Encounter (HOSPITAL_COMMUNITY)

## 2023-07-21 DIAGNOSIS — I10 Essential (primary) hypertension: Secondary | ICD-10-CM

## 2023-07-28 ENCOUNTER — Encounter (HOSPITAL_COMMUNITY): Payer: BC Managed Care – PPO

## 2023-07-29 ENCOUNTER — Encounter (HOSPITAL_COMMUNITY): Payer: Self-pay

## 2023-07-29 ENCOUNTER — Ambulatory Visit: Payer: BC Managed Care – PPO | Admitting: Family Medicine

## 2023-07-29 ENCOUNTER — Ambulatory Visit (HOSPITAL_COMMUNITY)

## 2023-07-29 DIAGNOSIS — S76311A Strain of muscle, fascia and tendon of the posterior muscle group at thigh level, right thigh, initial encounter: Secondary | ICD-10-CM

## 2023-07-29 DIAGNOSIS — M25551 Pain in right hip: Secondary | ICD-10-CM

## 2023-07-29 NOTE — Therapy (Signed)
 OUTPATIENT PHYSICAL THERAPY LOWER EXTREMITY TREATMENT NOTE  Progress Note Reporting Period 06/22/23 to 07/29/23  See note below for Objective Data and Assessment of Progress/Goals.     Patient Name: [name] MRN: [id number] DOB:Nov 29, 1980, 43 y.o., female Today's Date: 07/29/2023  END OF SESSION:  PT End of Session - 07/29/23 0851     Visit Number 9    Number of Visits 13    Date for PT Re-Evaluation 08/26/23    Authorization Type BCBS    Authorization Time Period no auth required    Progress Note Due on Visit 10    PT Start Time 0850    PT Stop Time 0930    PT Time Calculation (min) 40 min    Activity Tolerance Patient tolerated treatment well    Behavior During Therapy [facility] for tasks assessed/performed               Past Medical History:  Diagnosis Date   Hypertension    Kidney stone    Past Surgical History:  Procedure Laterality Date   TUBAL LIGATION     Patient Active Problem List   Diagnosis Date Noted   Urinary frequency 06/17/2023   Hamstring muscle strain, right, subsequent encounter 05/16/2023   Right hip pain 03/16/2023   Anxiety 01/18/2023   Hyperpigmentation of skin 12/11/2022   Neuropathy 11/02/2022   Panic attack as reaction to stress 10/15/2022   Bipolar 1 disorder (HCC) 10/15/2022   Cervical cancer screening 09/30/2022   Rash in adult 09/08/2022   Primary hypertension 09/08/2022   Nephrolithiasis 10/30/2019   Pyelonephritis 10/30/2019   Tobacco abuse 10/30/2019   PCP: Gilmore Laroche  REFERRING PROVIDER: Gilmore Laroche, FNP  REFERRING DIAG: [phone] (ICD-10-CM) - Hamstring muscle strain, right, subsequent encounter  THERAPY DIAG:  Pain in right hip  Strain of right hamstring, initial encounter  Rationale for Evaluation and Treatment: Rehabilitation  ONSET DATE: 05/05/2023  SUBJECTIVE:   SUBJECTIVE STATEMENT: No reports of pain.  Pt has returned to work with improved standing tolerance 5-6 hours and ability to sit for  2 hours, does have radiating pain following sitting, she stops and able to resolve symptoms.  Feels she has improved by 60% with Rt hip.   PROGRESS NOTE 06/22/23: Patient states that she's not having pain today but was hurting after the last session. Patient states that PT has helped her and she's around 80% better now. Patient has been doing her HEP without issues. Patient thinks that the hamstrings got better and does not need PT for it but she thinks she needs PT for the R hip. Sitting is still difficult and still bothers her hip and can only tolerate 45 min-1 hour. Walking is not an issue at the moment and can walk for > 15 minutes without pain.  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 Yes: NPRS scale: 3 Pain location: right hamstring Pain description: tight and sore Aggravating factors: walking too much, sitting too much Relieving factors: laying with leg propped  PRECAUTIONS: None     WEIGHT BEARING RESTRICTIONS: No  FALLS:  Has patient fallen in last 6 months? Yes. Number of falls 1  OCCUPATION: manager at [employer]  PLOF: Independent  PATIENT GOALS: to be 100%  NEXT MD VISIT: sees Hilda Lias on Thursday 1/16  OBJECTIVE:  Note: Objective measures were completed at Evaluation unless otherwise noted.  DIAGNOSTIC FINDINGS:  CLINICAL DATA:  Recent fall with right hip pain, initial  encounter   EXAM: DG HIP (WITH OR WITHOUT PELVIS) 2v RIGHT   COMPARISON:  03/16/2023   FINDINGS: Pelvic ring is intact. No acute fracture or dislocation is noted. No soft tissue abnormality is seen.   IMPRESSION: No acute abnormality noted.  PATIENT SURVEYS:  LEFS 06/22/23 67/80 = 83.8% from 37/80 46.3%  COGNITION: Overall cognitive status: Within functional limits for tasks assessed     SENSATION: WFL  EDEMA:  Not currently but did have swelling initially  MUSCLE LENGTH: Hamstrings: Right 06/22/23: 60 deg from 38 deg; Left 70 deg  AAROM  POSTURE: No Significant postural limitations  PALPATION: Tender origin and insertion areas  LOWER EXTREMITY ROM:  Active ROM Right eval Left eval Right 06/22/23 Left 06/22/23 Right 07/29/23 Left  07/29/23  Hip flexion   [facility]    Hip extension   [facility]    Hip abduction   [facility]    Hip adduction        Hip internal rotation   30 30    Hip external rotation   30 40 32 40  Knee flexion 110 122 135 WFL    Knee extension        Ankle dorsiflexion        Ankle plantarflexion        Ankle inversion        Ankle eversion         (Blank rows = not tested)  LOWER EXTREMITY MMT:  MMT Right eval Left eval Right 06/22/23 Left 06/22/23 Right 07/29/23 Left 07/29/23  Hip flexion   5 5    Hip extension   3+ 3+ 3+ 4  Hip abduction   4- 4- 4/5 4/5  Hip adduction        Hip internal rotation        Hip external rotation        Knee flexion 3- 4+ 4+ 5 4+   Knee extension   5 5    Ankle dorsiflexion   5 5    Ankle plantarflexion   5 5    Ankle inversion        Ankle eversion         (Blank rows = not tested)  FUNCTIONAL TESTS:  5 times sit to stand: 06/22/23: 8.44 sec 2 minute walk test: 06/22/23: 515 ft  GAIT: 06/22/23 Distance walked: 515 ft Assistive device utilized: None Level of assistance: Complete Independence Comments: WFL                                                                                                                                TREATMENT DATE:  07/29/23 529ft MMT see above ROM Measurement Reviewed goals LEFS: 68/80 Educated techniques for trigger point release with tennis ball  7in step height reciprocal pattern 1RT  Standing: Squat 10x  07/15/2023  -Seated Piriformis stretch 2 x 1' -Modified criss cross sitting with BLE on box  to alleviate hip flexion and pt provided passive abduction force at knees with cues for anterior trunk lean 3 x 30'  -Long axis distraction @ R hip x 10 for 5 second holds -Supine lateral distraction  mobilization 10 x 5 -Quadruped fire hydrants 3 x 10 -Standing hip adduction stretch with posterolateral lunge with BUE support @ // bars x 10 bilaterally -Modified lunge with L knee on 6in box and pad x 5 in // bars  07/08/23 Recumbent bike, seat 8, level 1, 5' Self-anterior R hip mob with a belt x 3" x 10 Self-lat distraction R hip mob with a belt x 20 x 2 Seated R piriformis stretch x 30" Seated hamstring stretch x 30" x 3 Push/pull a sled x 40 lbs x 4 rounds  06/22/23 Progress note (LEFS, 5TSTS, , MMT, ROM)    PATIENT EDUCATION:  Education details: Patient educated on exam findings, POC, scope of PT, HEP, and what to expect next visit. Person educated: Patient Education method: Explanation, Demonstration, and Handouts Education comprehension: verbalized understanding, returned demonstration, verbal cues required, and tactile cues required  HOME EXERCISE PROGRAM: 07/08/23 - Seated Table Piriformis Stretch  - 1 x daily - 7 x weekly - 3 reps - 30 hold 06/01/23 calf stretch, soleus stretch, standing hamstring stretch  Access Code: [email or web address] URL: [email or web address] Date: 05/25/2023 Prepared by: AP - Rehab  Exercises - Seated Hamstring Stretch  - 2 x daily - 7 x weekly - 1 sets - 5 reps - 20 sec hold - Seated Table Hamstring Stretch  - 2 x daily - 7 x weekly - 1 sets - 5 reps - 20 sec hold - Supine Hamstring Stretch  - 2 x daily - 7 x weekly - 1 sets - 5 reps - 20 sec hold Access Code: [email or web address] URL: [email or web address] Date: 05/28/2023 Prepared by: Starling Manns  Exercises - Prone Knee Flexion AROM  - 1 x daily - 7 x weekly - 3 sets - 10 reps - Prone Hip Extension with Bent Knee  - 1 x daily - 7 x weekly - 3 sets - 10 reps - Clamshell  - 1 x daily - 7 x weekly - 3 sets - 10 reps - Supine Bridge  - 1 x daily - 7 x weekly - 3 sets - 10 reps  07/29/23: -squat -Bridge ASSESSMENT:  CLINICAL IMPRESSION: Pt tolerating session well. Reports  easy fatigue at work and educated on importance of maintenance of strengthening, mobility as well as continued work to promote adequate return to work conditioning. Continued to address R hip external rotation deficits with mobilization, stretching and active strengthening, tolerated ADL activity well to replicate work with reduced depth level. Cues given for proper movement pattern as pt shows natural adduction during modified lunge activity. To date, skilled PT is required to address the impairments and improve function.  Progress note complete with these objective findings:  Pt independent with HEP and states she feels 60% improvements.  Pt presents with increased cadence and improved LEFS by 1 point since last assessed on 06/22/23.  Pt continues to show deficits with gluteal weakness and limited Rt hip ER with increased effort/discomfort while donning shoes/socks.  Pt will continue to benefit from skilled intervention to address goals unmet.  Added gluteal strengthening exercises to HEP, given printout and verbalized understanding.   OBJECTIVE IMPAIRMENTS: decreased ROM, decreased strength, hypomobility, impaired perceived functional ability, and pain.   ACTIVITY LIMITATIONS: sitting  PARTICIPATION LIMITATIONS: driving  REHAB POTENTIAL: Good   GOALS:  Goals reviewed with patient? No  SHORT TERM GOALS: Target date: 06/08/2023 patient will be independent with initial HEP Baseline: Goal status: MET  2.  Patient will self report 50% improvement to improve tolerance for functional activity  Baseline:  Goal status: MET    LONG TERM GOALS: Target date: 07/20/2023  Patient will be independent in self management strategies to improve quality of life and functional outcomes.  Baseline:  Goal status: MET  2.  Patient will self report 75% improvement to improve tolerance for functional activity  Baseline:  Goal status: MET  3.  Patient will increase right SLR to equal to left  SLR Baseline: 06/22/23: 60 deg: 07/29/23: Lt 85; Rt 83 degrees Goal status: MET  4.  Patient will improve LEFS score by 10 points or more to demonstrate improve perceived functional mobility Baseline: 37/80; 06/22/23: 67/80; 68/80 Goal status: MET  5.  Patient will ambulate x 15 minutes without leg pain to prepare for return to work activities Baseline:  Goal status: MET  6. Patient will be able to tolerate prolonged sitting > 1 hour with slight pain (2-3/10) to facilitate ease in travel Baseline: 3/20:  Reports ability to sit for 2 hours, does have radiating pain down to shin, able to resolve with walking. Goal status: MET  7. Patient will be able to demonstrate increase in hip ER by 5-10 degrees to facilitate ease in LE dressing Baseline: 30 deg Goal status: on going    PLAN:  PT FREQUENCY: 1x/week  PT DURATION: 4 weeks  PLANNED INTERVENTIONS: 97164- PT Re-evaluation, 97110-Therapeutic exercises, 97530- Therapeutic activity, 97112- Neuromuscular re-education, 97535- Self Care, 16109- Manual therapy, [phone]- Gait training, Patient/Family education, Stair training, Taping, Dry Needling, Joint mobilization,   PLAN FOR NEXT SESSION: Continue POC 1x week for 4 more weeks with emphasis on hip ROM and core and hip strength.     Becky Sax, LPTA/CLT; CBIS [phone]  3:05 PM, 07/29/23

## 2023-07-30 NOTE — Addendum Note (Signed)
 Addended byWilhemena Durie S on: 07/30/2023 11:31 AM   Modules accepted: Orders

## 2023-08-03 ENCOUNTER — Encounter (HOSPITAL_COMMUNITY)

## 2023-08-04 ENCOUNTER — Ambulatory Visit (HOSPITAL_COMMUNITY)

## 2023-08-04 ENCOUNTER — Encounter (HOSPITAL_COMMUNITY): Payer: Self-pay

## 2023-08-04 DIAGNOSIS — M25551 Pain in right hip: Secondary | ICD-10-CM | POA: Diagnosis not present

## 2023-08-04 DIAGNOSIS — S76311A Strain of muscle, fascia and tendon of the posterior muscle group at thigh level, right thigh, initial encounter: Secondary | ICD-10-CM

## 2023-08-04 NOTE — Therapy (Signed)
 OUTPATIENT PHYSICAL THERAPY LOWER EXTREMITY TREATMENT NOTE   Patient Name: [name] MRN: [id number] DOB:1980/10/20, 43 y.o., female Today's Date: 08/04/2023  END OF SESSION:  PT End of Session - 08/04/23 0854     Visit Number 10    Number of Visits 13    Date for PT Re-Evaluation 08/26/23    Authorization Type BC[employer]    Authorization Time Period no auth required    PT Start Time 0854    PT Stop Time 0930    PT Time Calculation (min) 36 min    Activity Tolerance Patient tolerated treatment well    Behavior During Therapy [facility] for tasks assessed/performed               Past Medical History:  Diagnosis Date   Hypertension    Kidney stone    Past Surgical History:  Procedure Laterality Date   TUBAL LIGATION     Patient Active Problem List   Diagnosis Date Noted   Urinary frequency 06/17/2023   Hamstring muscle strain, right, subsequent encounter 05/16/2023   Right hip pain 03/16/2023   Anxiety 01/18/2023   Hyperpigmentation of skin 12/11/2022   Neuropathy 11/02/2022   Panic attack as reaction to stress 10/15/2022   Bipolar 1 disorder (HCC) 10/15/2022   Cervical cancer screening 09/30/2022   Rash in adult 09/08/2022   Primary hypertension 09/08/2022   Nephrolithiasis 10/30/2019   Pyelonephritis 10/30/2019   Tobacco abuse 10/30/2019   PCP: Gilmore Laroche  REFERRING PROVIDER: Gilmore Laroche, FNP  REFERRING DIAG: [phone] (ICD-10-CM) - Hamstring muscle strain, right, subsequent encounter  THERAPY DIAG:  Pain in right hip  Strain of right hamstring, initial encounter  Rationale for Evaluation and Treatment: Rehabilitation  ONSET DATE: 05/05/2023  SUBJECTIVE:   SUBJECTIVE STATEMENT: Reports R leg feels okay today. Reports hips feel tight today but otherwise good. Reports HEP is going well. Probably needs to do core exercises more. But she walks a lot at work. Wants to look into joining gym.    PROGRESS NOTE 06/22/23: Patient states that she's not  having pain today but was hurting after the last session. Patient states that PT has helped her and she's around 80% better now. Patient has been doing her HEP without issues. Patient thinks that the hamstrings got better and does not need PT for it but she thinks she needs PT for the R hip. Sitting is still difficult and still bothers her hip and can only tolerate 45 min-1 hour. Walking is not an issue at the moment and can walk for > 15 minutes without pain.  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 Yes: NPRS scale: 3 Pain location: right hamstring Pain description: tight and sore Aggravating factors: walking too much, sitting too much Relieving factors: laying with leg propped  PRECAUTIONS: None     WEIGHT BEARING RESTRICTIONS: No  FALLS:  Has patient fallen in last 6 months? Yes. Number of falls 1  OCCUPATION: manager at [employer]  PLOF: Independent  PATIENT GOALS: to be 100%  NEXT MD VISIT: sees Hilda Lias on Thursday 1/16  OBJECTIVE:  Note: Objective measures were completed at Evaluation unless otherwise noted.  DIAGNOSTIC FINDINGS:  CLINICAL DATA:  Recent fall with right hip pain, initial encounter   EXAM: DG HIP (WITH OR WITHOUT PELVIS) 2v RIGHT   COMPARISON:  03/16/2023   FINDINGS: Pelvic ring is intact. No acute fracture or dislocation is noted. No soft tissue abnormality is  seen.   IMPRESSION: No acute abnormality noted.  PATIENT SURVEYS:  LEFS 06/22/23 67/80 = 83.8% from 37/80 46.3%  COGNITION: Overall cognitive status: Within functional limits for tasks assessed     SENSATION: WFL  EDEMA:  Not currently but did have swelling initially  MUSCLE LENGTH: Hamstrings: Right 06/22/23: 60 deg from 38 deg; Left 70 deg AAROM  POSTURE: No Significant postural limitations  PALPATION: Tender origin and insertion areas  LOWER EXTREMITY ROM:  Active ROM Right eval Left eval Right 06/22/23 Left 06/22/23  Right 07/29/23 Left  07/29/23  Hip flexion   [facility]    Hip extension   [facility]    Hip abduction   [facility]    Hip adduction        Hip internal rotation   30 30    Hip external rotation   30 40 32 40  Knee flexion 110 122 135 WFL    Knee extension        Ankle dorsiflexion        Ankle plantarflexion        Ankle inversion        Ankle eversion         (Blank rows = not tested)  LOWER EXTREMITY MMT:  MMT Right eval Left eval Right 06/22/23 Left 06/22/23 Right 07/29/23 Left 07/29/23  Hip flexion   5 5    Hip extension   3+ 3+ 3+ 4  Hip abduction   4- 4- 4/5 4/5  Hip adduction        Hip internal rotation        Hip external rotation        Knee flexion 3- 4+ 4+ 5 4+   Knee extension   5 5    Ankle dorsiflexion   5 5    Ankle plantarflexion   5 5    Ankle inversion        Ankle eversion         (Blank rows = not tested)  FUNCTIONAL TESTS:  5 times sit to stand: 06/22/23: 8.44 sec 2 minute walk test: 06/22/23: 515 ft  GAIT: 06/22/23 Distance walked: 515 ft Assistive device utilized: None Level of assistance: Complete Independence Comments: WFL                                                                                                                                TREATMENT DATE:  08/04/2023 -Recumbent Bike, seat 9, 5' -Trial IR/ER mobility in supine, not much relief -Supine Piriformis Stretch, 3x30" -Supine Hamstring stretch w/ belt, emphasis on lateral hams w/ hip add, 2x30" -Hip 3 way/forward, lateral, and backward lunges in // bars, moving LE on wash cloth, 10x each way, bilat. -RDL: 5lb in box x10, 10lb B28, 10lb with RTB around knees x 10 -Side steps w/ mini squat, Down and back 20' x2, RTB at knees  07/29/23 561ft MMT see above ROM  Measurement Reviewed goals LEFS: 68/80 Educated techniques for trigger point release with tennis ball  7in step height reciprocal pattern 1RT  Standing: Squat 10x  07/15/2023  -Seated Piriformis stretch 2 x  1' -Modified criss cross sitting with BLE on box to alleviate hip flexion and pt provided passive abduction force at knees with cues for anterior trunk lean 3 x 30'  -Long axis distraction @ R hip x 10 for 5 second holds -Supine lateral distraction mobilization 10 x 5 -Quadruped fire hydrants 3 x 10 -Standing hip adduction stretch with posterolateral lunge with BUE support @ // bars x 10 bilaterally -Modified lunge with L knee on 6in box and pad x 5 in // bars   PATIENT EDUCATION:  Education details: Patient educated on exam findings, POC, scope of PT, HEP, and what to expect next visit. Person educated: Patient Education method: Explanation, Demonstration, and Handouts Education comprehension: verbalized understanding, returned demonstration, verbal cues required, and tactile cues required  HOME EXERCISE PROGRAM: 07/08/23 - Seated Table Piriformis Stretch  - 1 x daily - 7 x weekly - 3 reps - 30 hold 06/01/23 calf stretch, soleus stretch, standing hamstring stretch  Access Code: [email or web address] URL: [email or web address] Date: 05/25/2023 Prepared by: [facility] - Rehab  Exercises - Seated Hamstring Stretch  - 2 x daily - 7 x weekly - 1 sets - 5 reps - 20 sec hold - Seated Table Hamstring Stretch  - 2 x daily - 7 x weekly - 1 sets - 5 reps - 20 sec hold - Supine Hamstring Stretch  - 2 x daily - 7 x weekly - 1 sets - 5 reps - 20 sec hold Access Code: [email or web address] URL: [email or web address] Date: 05/28/2023 Prepared by: Starling Manns  Exercises - Prone Knee Flexion AROM  - 1 x daily - 7 x weekly - 3 sets - 10 reps - Prone Hip Extension with Bent Knee  - 1 x daily - 7 x weekly - 3 sets - 10 reps - Clamshell  - 1 x daily - 7 x weekly - 3 sets - 10 reps - Supine Bridge  - 1 x daily - 7 x weekly - 3 sets - 10 reps  07/29/23: -squat -Bridge ASSESSMENT:  CLINICAL IMPRESSION: Patient tolerates session well with the appropriate amount of fatigue. Today session focused on R  hip strengthening and mobility, due to reports of increased tightness on this date. Patient reports improvements with R hamstring area pain but increased R hip tightness since increasing activity at work. Trial of hip IR/ER stretching in supine is not as effective as piriformis stretching for her tight area. Posterior chain strength addressed with RDLs and mini squat+side stepping, little verbal cueing to form needed. Patient will continue to benefit from continued skilled physical therapy to address the above impairments to return to PLOF.      OBJECTIVE IMPAIRMENTS: decreased ROM, decreased strength, hypomobility, impaired perceived functional ability, and pain.   ACTIVITY LIMITATIONS: sitting  PARTICIPATION LIMITATIONS: driving  REHAB POTENTIAL: Good   GOALS: Goals reviewed with patient? No  SHORT TERM GOALS: Target date: 06/08/2023 patient will be independent with initial HEP Baseline: Goal status: MET  2.  Patient will self report 50% improvement to improve tolerance for functional activity  Baseline:  Goal status: MET    LONG TERM GOALS: Target date: 07/20/2023  Patient will be independent in self management strategies to improve quality of life and functional outcomes.  Baseline:  Goal status: MET  2.  Patient will self report 75%  improvement to improve tolerance for functional activity  Baseline:  Goal status: MET  3.  Patient will increase right SLR to equal to left SLR Baseline: 06/22/23: 60 deg: 07/29/23: Lt 85; Rt 83 degrees Goal status: MET  4.  Patient will improve LEFS score by 10 points or more to demonstrate improve perceived functional mobility Baseline: 37/80; 06/22/23: 67/80; 68/80 Goal status: MET  5.  Patient will ambulate x 15 minutes without leg pain to prepare for return to work activities Baseline:  Goal status: MET  6. Patient will be able to tolerate prolonged sitting > 1 hour with slight pain (2-3/10) to facilitate ease in  travel Baseline: 3/20:  Reports ability to sit for 2 hours, does have radiating pain down to shin, able to resolve with walking. Goal status: MET  7. Patient will be able to demonstrate increase in hip ER by 5-10 degrees to facilitate ease in LE dressing Baseline: 30 deg Goal status: on going    PLAN:  PT FREQUENCY: 1x/week  PT DURATION: 4 weeks  PLANNED INTERVENTIONS: 97164- PT Re-evaluation, 97110-Therapeutic exercises, 97530- Therapeutic activity, 97112- Neuromuscular re-education, 97535- Self Care, 36644- Manual therapy, [phone]- Gait training, Patient/Family education, Stair training, Taping, Dry Needling, Joint mobilization,   PLAN FOR NEXT SESSION: Continue POC 1x week for 4 more weeks with emphasis on hip ROM and core and hip strength.      9:47 AM, 08/04/23 Chryl Heck, PT, DPT [facility]

## 2023-08-09 ENCOUNTER — Encounter (HOSPITAL_COMMUNITY)

## 2023-09-02 ENCOUNTER — Ambulatory Visit: Admitting: Family Medicine

## 2023-09-07 ENCOUNTER — Encounter: Payer: Self-pay | Admitting: Family Medicine

## 2023-10-19 ENCOUNTER — Other Ambulatory Visit: Payer: Self-pay | Admitting: Family Medicine

## 2023-10-19 DIAGNOSIS — I10 Essential (primary) hypertension: Secondary | ICD-10-CM

## 2023-11-16 ENCOUNTER — Ambulatory Visit: Admitting: Nurse Practitioner

## 2023-11-17 ENCOUNTER — Ambulatory Visit (INDEPENDENT_AMBULATORY_CARE_PROVIDER_SITE_OTHER): Admitting: Family Medicine

## 2023-11-17 ENCOUNTER — Encounter: Payer: Self-pay | Admitting: Family Medicine

## 2023-11-17 ENCOUNTER — Ambulatory Visit: Admitting: Nurse Practitioner

## 2023-11-17 VITALS — BP 119/76 | HR 90 | Resp 16 | Ht 66.0 in | Wt 201.1 lb

## 2023-11-17 DIAGNOSIS — R079 Chest pain, unspecified: Secondary | ICD-10-CM | POA: Diagnosis not present

## 2023-11-17 DIAGNOSIS — F319 Bipolar disorder, unspecified: Secondary | ICD-10-CM

## 2023-11-17 DIAGNOSIS — Z720 Tobacco use: Secondary | ICD-10-CM | POA: Diagnosis not present

## 2023-11-17 DIAGNOSIS — F4321 Adjustment disorder with depressed mood: Secondary | ICD-10-CM

## 2023-11-17 DIAGNOSIS — I10 Essential (primary) hypertension: Secondary | ICD-10-CM | POA: Diagnosis not present

## 2023-11-17 DIAGNOSIS — F430 Acute stress reaction: Secondary | ICD-10-CM

## 2023-11-17 DIAGNOSIS — F410 Panic disorder [episodic paroxysmal anxiety] without agoraphobia: Secondary | ICD-10-CM

## 2023-11-17 MED ORDER — CLONAZEPAM 0.5 MG PO TABS: 0.5000 mg | ORAL_TABLET | ORAL | 0 refills | Status: DC | PRN

## 2023-11-17 MED ORDER — BUSPIRONE HCL 5 MG PO TABS: 5.0000 mg | ORAL_TABLET | Freq: Two times a day (BID) | ORAL | 0 refills | Status: DC

## 2023-11-17 NOTE — Patient Instructions (Addendum)
 F/U  with PCP in next 4 to 6 weeks, pls   BMP and EGFr and Magnesium level today  EKG in office today looks normal, Due to multiple risk factors for heart disease and your ongoing intermittent chest discomfort , you are also being referred to cardiology  Please work on quitting smoking , 1800QUITNOW gives 24/7 support  Blood pressure in office today is nORMAL  New for anxiety is buspar  one tablet twice daily, you also have a limited amount of klonopin  prescribed for extreme anxiety / panic  Intentionally cut back on alcohol consumption as we discussed please  Thanks for choosing [facility], we consider it a privelige to serve you.

## 2023-11-18 ENCOUNTER — Ambulatory Visit: Payer: Self-pay | Admitting: Family Medicine

## 2023-11-19 LAB — BMP8+EGFR
BUN/Creatinine Ratio: 9 (ref 9–23)
BUN: 7 mg/dL (ref 6–24)
CO2: 22 mmol/L (ref 20–29)
Calcium: 9.7 mg/dL (ref 8.7–10.2)
Chloride: 100 mmol/L (ref 96–106)
Creatinine, Ser: 0.82 mg/dL (ref 0.57–1.00)
Glucose: 92 mg/dL (ref 70–99)
Potassium: 3.8 mmol/L (ref 3.5–5.2)
Sodium: 138 mmol/L (ref 134–144)
eGFR: 90 mL/min/1.73 (ref >59)

## 2023-11-19 LAB — MAGNESIUM: Magnesium: 2 mg/dL (ref 1.6–2.3)

## 2023-11-20 ENCOUNTER — Encounter: Payer: Self-pay | Admitting: Family Medicine

## 2023-11-20 DIAGNOSIS — R079 Chest pain, unspecified: Secondary | ICD-10-CM | POA: Insufficient documentation

## 2023-11-20 DIAGNOSIS — F4321 Adjustment disorder with depressed mood: Secondary | ICD-10-CM | POA: Insufficient documentation

## 2023-11-20 NOTE — Assessment & Plan Note (Signed)
 Controlled, no change in medication DASH diet and commitment to daily physical activity for a minimum of 30 minutes discussed and encouraged, as a part of hypertension management. The importance of attaining a healthy weight is also discussed.     11/17/2023    1:05 PM 07/08/2023    8:24 AM 06/18/2023    8:55 PM 06/18/2023    3:40 PM 06/18/2023    3:39 PM 06/17/2023    4:13 PM 06/17/2023    3:26 PM  BP/Weight  Systolic BP 119 117 165  156 147 146  Diastolic BP 76 79 98  91 89 92  Wt. (Lbs) 201.08 190  189   189  BMI 32.46 kg/m2 30.67 kg/m2  30.51 kg/m2   30.51 kg/m2

## 2023-11-20 NOTE — Progress Notes (Signed)
 [name]     MRN: [id number]      DOB: [birth year]  Chief Complaint  Patient presents with   Hypertension    Has concerns about bp running high. Has been running in the 130s over 90s-100s. Denies headache, blurred vision, and dizziness.     HPI Ms. [name] is here with above stated concern Also voices concern over recurrent chest pain and heart disease which runs in her family, she does have incrweased personal risk factors also C/o increased stress and anxiety, recently lost her dad, is the  go to  person, both at work and on the job, thinks she uses this as a coping mechanism Does report possible self medicating with alcohol to some extent, though dos ot believe she is alcohol depenedent , and states she does not want to become this way SDtates has been dx as bipolar, does not like how meds have made her feel in the past , not keen on rsuming psyh treatment , not suicidal or homicidal, has had very limited amt of klonopin  prescribed in the past and believes this would be beneficial at this tiome   ROS Denies recent fever or chills. Denies sinus pressure, nasal congestion, ear pain or sore throat. Denies chest congestion, productive cough or wheezing. Denies PND, orthopnea palpitations and leg swelling Denies abdominal pain, nausea, vomiting,diarrhea or constipation.   Denies dysuria, frequency, hesitancy or incontinence. Denies joint pain, swelling and limitation in mobility. Denies headaches, seizures, numbness, or tingling.  Denies skin break down or rash.   PE  BP 119/76   Pulse 90   Resp 16   Ht 5' 6 (1.676 m)   Wt 201 lb 1.3 oz (91.2 kg)   SpO2 99%   BMI 32.46 kg/m   Patient alert and oriented and in no cardiopulmonary distress.  HEENT: No facial asymmetry, EOMI,     Neck supple .  Chest: Clear to auscultation bilaterally.  CVS: S1, S2 no murmurs, no S3.Regular rate. EKG: N SR, no ischemia, no LVH   Ext: No edema  MS: Adequate ROM spine, shoulders,  hips and knees.  Skin: Intact, no ulcerations or rash noted.  Psych: Good eye contact, normal affect. Memory intact  anxious at times tearfl  CNS: CN 2-12 intact, power,  normal throughout.no focal deficits noted.   Assessment & Plan  Primary hypertension Controlled, no change in medication DASH diet and commitment to daily physical activity for a minimum of 30 minutes discussed and encouraged, as a part of hypertension management. The importance of attaining a healthy weight is also discussed.     11/17/2023    1:05 PM 07/08/2023    8:24 AM 06/18/2023    8:55 PM 06/18/2023    3:40 PM 06/18/2023    3:39 PM 06/17/2023    4:13 PM 06/17/2023    3:26 PM  BP/Weight  Systolic BP 119 117 165  156 147 146  Diastolic BP 76 79 98  91 89 92  Wt. (Lbs) 201.08 190  189   189  BMI 32.46 kg/m2 30.67 kg/m2  30.51 kg/m2   30.51 kg/m2       Tobacco abuse Asked:confirms currently smokes cigarettes Assess: Unwilling to set a quit date, 1800QUITNOW provided Advise: needs to QUIT to reduce risk of cancer, cardio and cerebrovascular disease Assist: counseled for 5 minutes and literature provided Arrange: follow up in 2 to 4 months   Panic attack as reaction to stress C/o increased stress due to recent passing  of her father , requests limited klonopin  which has helped in the past Encouraged and prescribed buspar  for chronic use, thinks she has ahd in the past and did not like how it made her feel. Needs f/u with PCP in 5 to 8 weeks  Chest pain Intermittent chest pain with several risk factors for CAD Office EKG: nSR, no ischemia , no LVH Refer cardiology for further eval as deemed necessary pt concerned about heart   Bipolar 1 disorder (HCC) On n ol medication , will need to f/u with PCP  Grieving Recent loss of father after brief illnessin past 1 month, grieving occurring though inadequate personal time to adjust and cope

## 2023-11-20 NOTE — Assessment & Plan Note (Signed)
 C/o increased stress due to recent passing of her father , requests limited klonopin  which has helped in the past Encouraged and prescribed buspar  for chronic use, thinks she has ahd in the past and did not like how it made her feel. Needs f/u with PCP in 5 to 8 weeks

## 2023-11-20 NOTE — Assessment & Plan Note (Signed)
 Asked:confirms currently smokes cigarettes Assess: Unwilling to set a quit date, 1800QUITNOW provided Advise: needs to QUIT to reduce risk of cancer, cardio and cerebrovascular disease Assist: counseled for 5 minutes and literature provided Arrange: follow up in 2 to 4 months

## 2023-11-20 NOTE — Assessment & Plan Note (Signed)
 Recent loss of father after brief illnessin past 1 month, grieving occurring though inadequate personal time to adjust and cope

## 2023-11-20 NOTE — Assessment & Plan Note (Signed)
 Intermittent chest pain with several risk factors for CAD Office EKG: nSR, no ischemia , no LVH Refer cardiology for further eval as deemed necessary pt concerned about heart

## 2023-11-20 NOTE — Assessment & Plan Note (Signed)
 On n ol medication , will need to f/u with PCP

## 2023-12-27 ENCOUNTER — Encounter: Payer: Self-pay | Admitting: Family Medicine

## 2023-12-27 ENCOUNTER — Ambulatory Visit (INDEPENDENT_AMBULATORY_CARE_PROVIDER_SITE_OTHER): Admitting: Family Medicine

## 2023-12-27 VITALS — BP 111/77 | HR 62 | Ht 66.0 in | Wt 203.0 lb

## 2023-12-27 DIAGNOSIS — F4321 Adjustment disorder with depressed mood: Secondary | ICD-10-CM

## 2023-12-27 DIAGNOSIS — I10 Essential (primary) hypertension: Secondary | ICD-10-CM | POA: Diagnosis not present

## 2023-12-27 DIAGNOSIS — F410 Panic disorder [episodic paroxysmal anxiety] without agoraphobia: Secondary | ICD-10-CM

## 2023-12-27 DIAGNOSIS — F430 Acute stress reaction: Secondary | ICD-10-CM | POA: Diagnosis not present

## 2023-12-27 MED ORDER — CLONAZEPAM 0.5 MG PO TABS: 0.5000 mg | ORAL_TABLET | ORAL | 0 refills | Status: AC | PRN

## 2023-12-27 NOTE — Progress Notes (Unsigned)
 Established Patient Office Visit  Subjective:  Patient ID: [name], female    DOB: 12-17-1980  Age: 43 y.o. MRN: [id number]  CC:  Chief Complaint  Patient presents with   Hypertension    Four week follow up    HPI [name] is a 43 y.o. female with past medical history of *** presents for f/u of *** chronic medical conditions.  Past Medical History:  Diagnosis Date   Hypertension    Kidney stone     Past Surgical History:  Procedure Laterality Date   TUBAL LIGATION      Family History  Problem Relation Age of Onset   Breast cancer Maternal Aunt    Breast cancer Cousin     Social History   Socioeconomic History   Marital status: Married    Spouse name: Not on file   Number of children: Not on file   Years of education: Not on file   Highest education level: Not on file  Occupational History   Not on file  Tobacco Use   Smoking status: Every Day    Current packs/day: 0.25    Types: Cigarettes   Smokeless tobacco: Never   Tobacco comments:    6 a day  Vaping Use   Vaping status: Former  Substance and Sexual Activity   Alcohol use: Yes    Alcohol/week: 5.0 standard drinks of alcohol    Types: 5 Shots of liquor per week    Comment: daily   Drug use: Not Currently    Types: Marijuana    Comment: occ; last use a few days ago.   Sexual activity: Yes    Birth control/protection: Surgical  Other Topics Concern   Not on file  Social History Narrative   Not on file   Social Drivers of Health   Financial Resource Strain: Not on file  Food Insecurity: Not on file  Transportation Needs: Not on file  Physical Activity: Not on file  Stress: Not on file  Social Connections: Not on file  Intimate Partner Violence: Not on file    Outpatient Medications Prior to Visit  Medication Sig Dispense Refill   acetaminophen  (TYLENOL ) 500 MG tablet Take 500 mg by mouth every 6 (six) hours as needed for headache.     amLODipine  (NORVASC ) 2.5 MG tablet  Take 1 tablet (2.5 mg total) by mouth daily. 30 tablet 2   busPIRone  (BUSPAR ) 5 MG tablet Take 1 tablet (5 mg total) by mouth 2 (two) times daily. 60 tablet 0   clonazePAM  (KLONOPIN ) 0.5 MG tablet Take 1 tablet (0.5 mg total) by mouth as needed for anxiety. Max:4mg /day 10 tablet 0   cyclobenzaprine  (FLEXERIL ) 5 MG tablet Take 1 tablet (5 mg total) by mouth 3 (three) times daily as needed for muscle spasms. 30 tablet 0   olmesartan  (BENICAR ) 20 MG tablet Take 1 tablet by mouth once daily 90 tablet 0   tretinoin  (RETIN-A ) 0.025 % cream Apply topically at bedtime. 45 g 0   Vitamin D , Ergocalciferol , (DRISDOL ) 1.25 MG (50000 UNIT) CAPS capsule Take 1 capsule (50,000 Units total) by mouth every 7 (seven) days. 20 capsule 1   No facility-administered medications prior to visit.    No Known Allergies  ROS Review of Systems    Objective:    Physical Exam  BP 111/77   Pulse 62   Ht 5' 6 (1.676 m)   Wt 203 lb (92.1 kg)   SpO2 98%   BMI 32.77  kg/m  Wt Readings from Last 3 Encounters:  12/27/23 203 lb (92.1 kg)  11/17/23 201 lb 1.3 oz (91.2 kg)  07/08/23 190 lb (86.2 kg)    Lab Results  Component Value Date   TSH 2.330 06/18/2023   Lab Results  Component Value Date   WBC 7.7 06/18/2023   HGB 13.7 06/18/2023   HCT 39.3 06/18/2023   MCV 96.8 06/18/2023   PLT 241 06/18/2023   Lab Results  Component Value Date   NA 138 11/17/2023   K 3.8 11/17/2023   CO2 22 11/17/2023   GLUCOSE 92 11/17/2023   BUN 7 11/17/2023   CREATININE 0.82 11/17/2023   BILITOT 0.8 06/18/2023   ALKPHOS 59 06/18/2023   AST 23 06/18/2023   ALT 17 06/18/2023   PROT 6.8 06/18/2023   ALBUMIN 4.2 06/18/2023   CALCIUM 9.7 11/17/2023   ANIONGAP 8 06/18/2023   EGFR 90 11/17/2023   Lab Results  Component Value Date   CHOL 189 06/18/2023   Lab Results  Component Value Date   HDL 78 06/18/2023   Lab Results  Component Value Date   LDLCALC 75 06/18/2023   Lab Results  Component Value Date   TRIG  227 (H) 06/18/2023   Lab Results  Component Value Date   CHOLHDL 2.4 06/18/2023   Lab Results  Component Value Date   HGBA1C 5.1 06/18/2023      Assessment & Plan:  There are no diagnoses linked to this encounter.  Follow-up: No follow-ups on file.   Nancy Kraker, FNP

## 2023-12-27 NOTE — Patient Instructions (Addendum)
 I appreciate the opportunity to provide care to you today!    Follow up:  5 months  Refill clonazepam  0.5 mg as needed for anxiety    Referrals today- Behavioral health for talk therapy   Attached with your AVS, you will find valuable resources for self-education. I highly recommend dedicating some time to thoroughly examine them.   Please continue to a heart-healthy diet and increase your physical activities. Try to exercise for at least five days a week.    It was a pleasure to see you and I look forward to continuing to work together on your health and well-being. Please do not hesitate to call the office if you need care or have questions about your care.  In case of emergency, please visit the Emergency Department for urgent care, or contact our clinic at [phone] to schedule an appointment. We're here to help you!   Have a wonderful day and week. With Gratitude, Tyce Delcid MSN, FNP-BC

## 2023-12-28 NOTE — Assessment & Plan Note (Signed)
 Controlled The patient is currently taking olmesartan  20 mg daily and reports discontinuing amlodipine  2.5 mg daily due to episodes of low blood pressure. She is asymptomatic. Lifestyle modifications were reinforced, including adherence to a low-sodium diet and increased physical activity. BP Readings from Last 3 Encounters:  12/27/23 111/77  11/17/23 119/76  07/08/23 117/79

## 2023-12-28 NOTE — Assessment & Plan Note (Signed)
 The patient reports that she never filled the prior prescription for clonazepam . A prescription for clonazepam  0.5 mg was resent, and the patient was encouraged to take it as needed for acute anxiety/panic symptoms.

## 2023-12-28 NOTE — Assessment & Plan Note (Signed)
 The patient reports experiencing multiple losses this year that she has not fully processed. She denies suicidal ideation and auditory/visual hallucinations. The patient is requesting a referral to behavioral health for talk therapy. Referral has been placed.

## 2023-12-31 ENCOUNTER — Encounter: Payer: Self-pay | Admitting: Radiology

## 2023-12-31 IMAGING — CT CT RENAL STONE PROTOCOL
2 of 4 series · 17 of 46 positions shown, 19 images · non-contrast
Comparison: Images from prior studies are unavailable at time
dictation.

CLINICAL DATA: Flank pain, kidney stone suspected



[Series 2: axial st · axial · 0.91mm/px · z∈[-434,-44]mm · 14 of 88 slices shown, 16 images]
[im 5/88  soft-tissue]
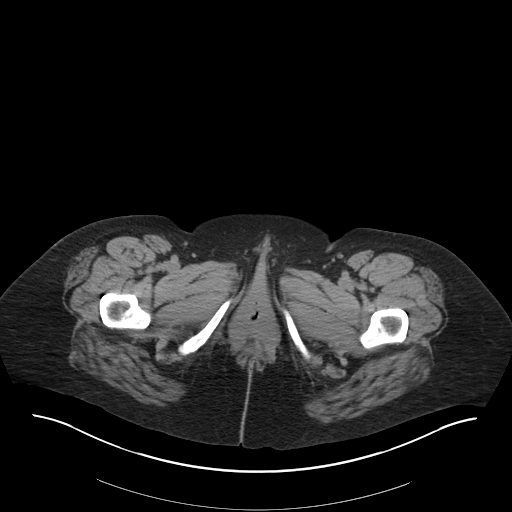
[im 5/88  bone]
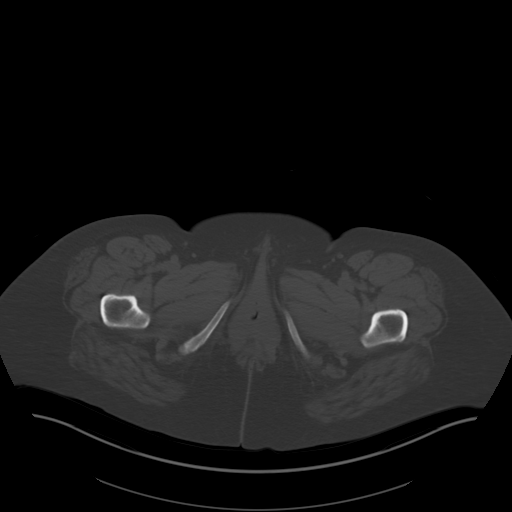
[im 10/88  soft-tissue]
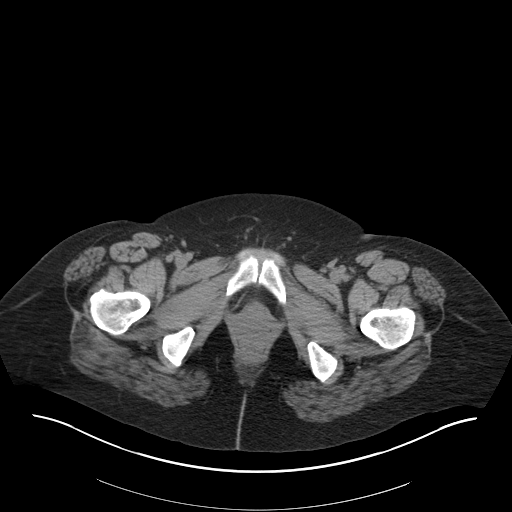
[im 20/88  soft-tissue]
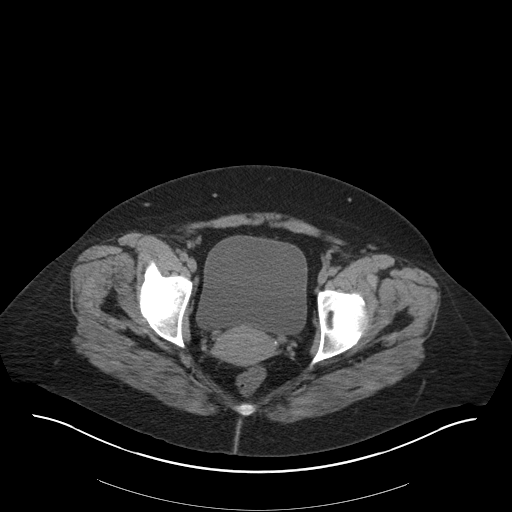
[im 25/88  soft-tissue]
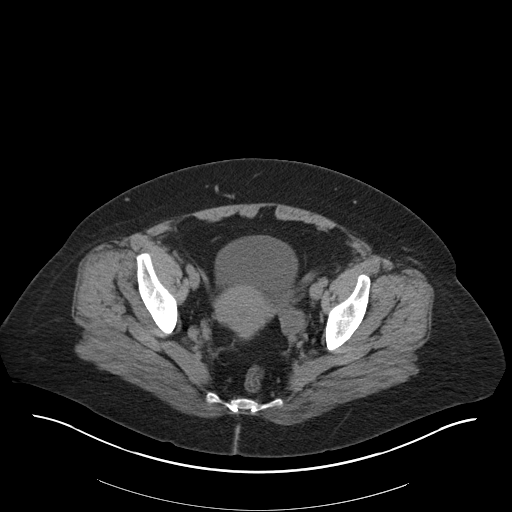
[im 30/88  soft-tissue]
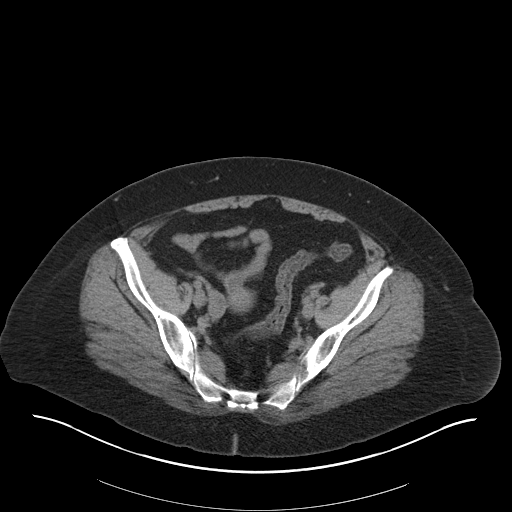
[im 34/88  soft-tissue]
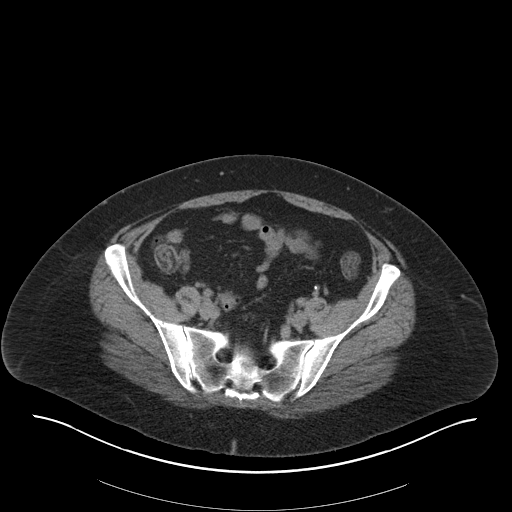
[im 39/88  soft-tissue]
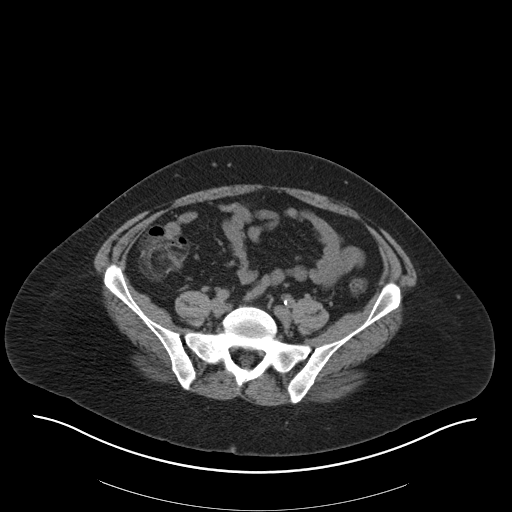
[im 49/88  soft-tissue]
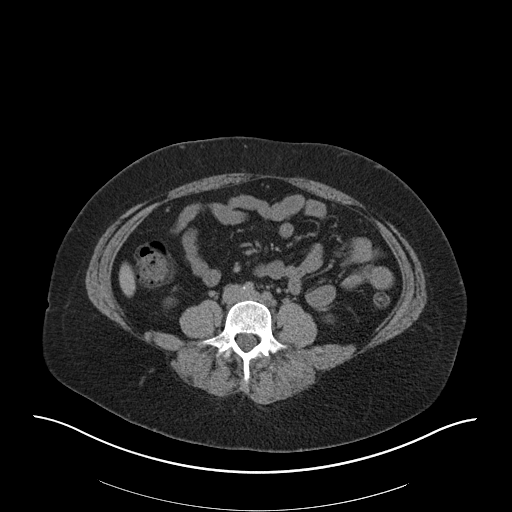
[im 54/88  soft-tissue]
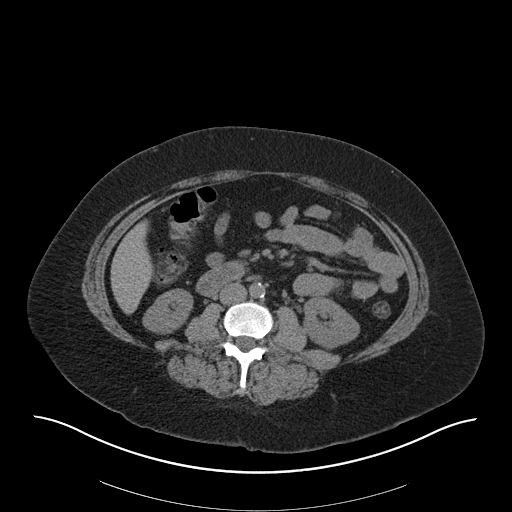
[im 54/88  bone]
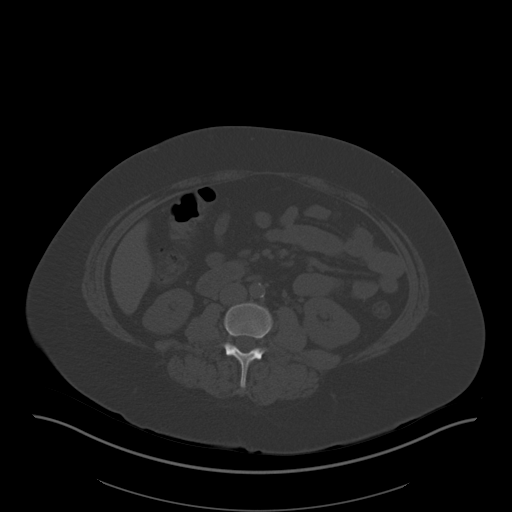
[im 59/88  soft-tissue]
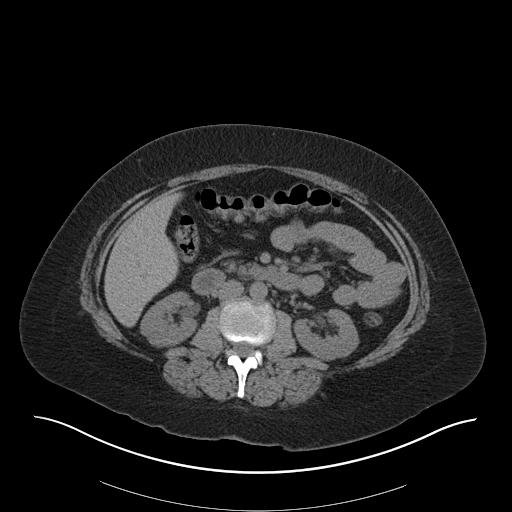
[im 63/88  soft-tissue]
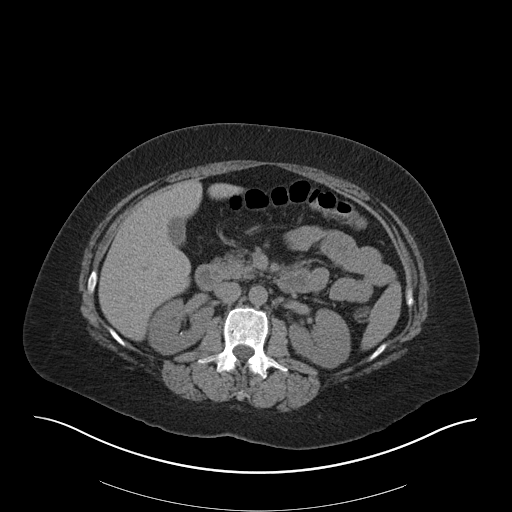
[im 68/88  soft-tissue]
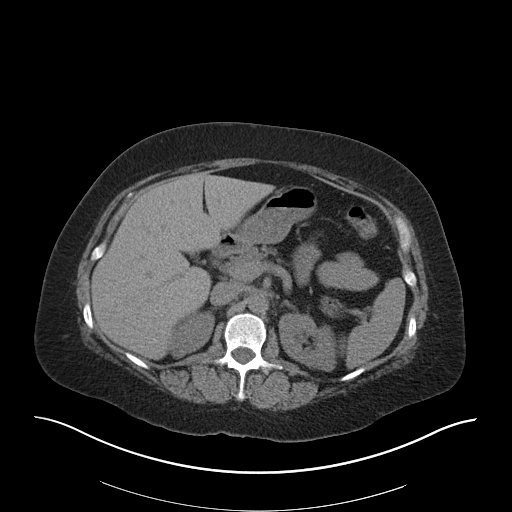
[im 78/88  soft-tissue]
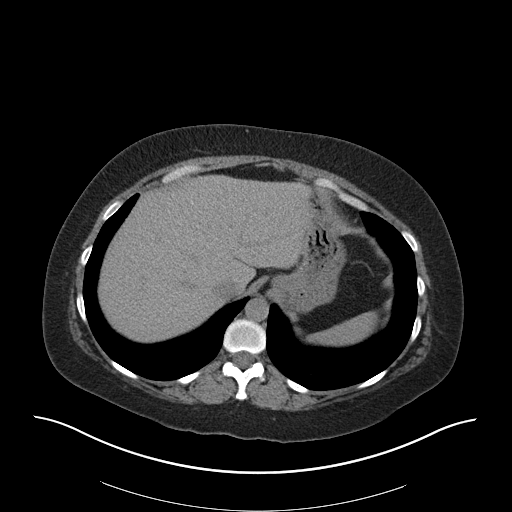
[im 83/88  soft-tissue]
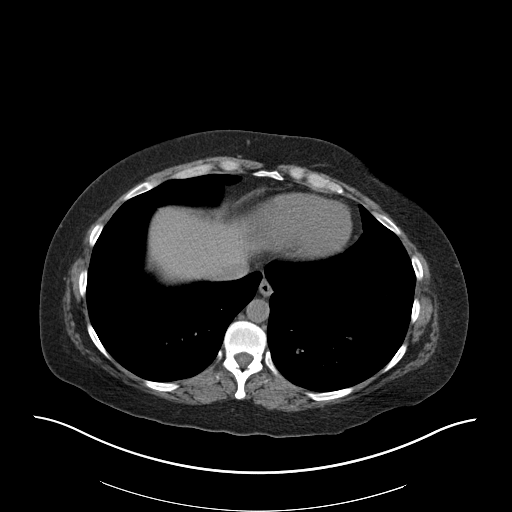

[Series 5: coronal · coronal · 0.87mm/px · 3 of 153 slices shown]
[im 51/153  soft-tissue]
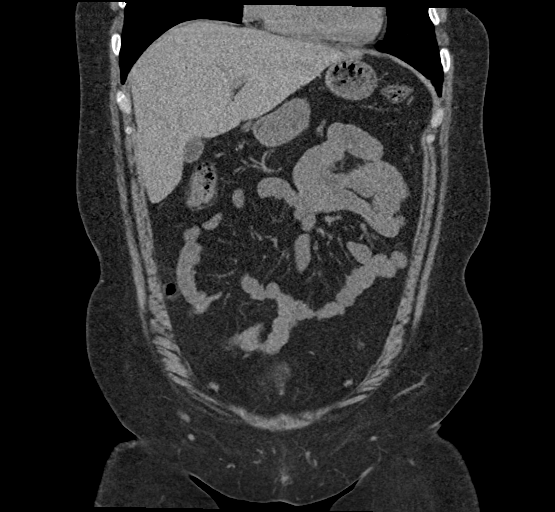
[im 68/153  soft-tissue]
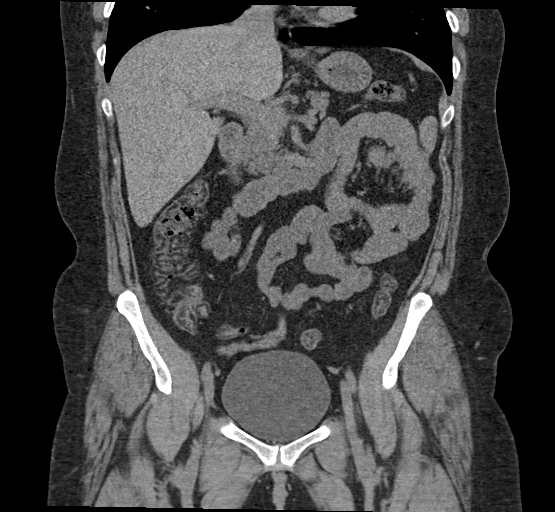
[im 85/153  soft-tissue]
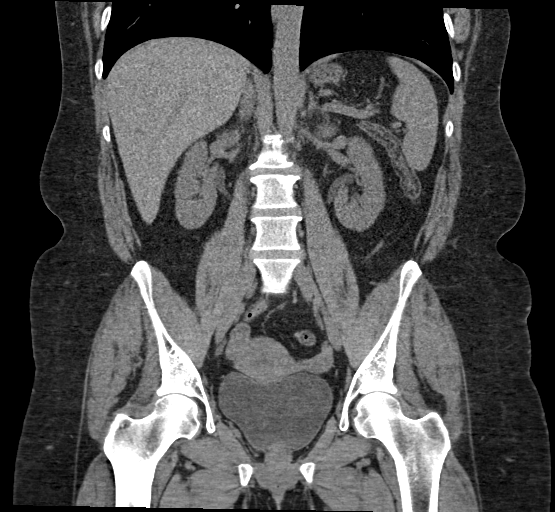

[17 of 46 positions shown; findings below may reference images not displayed]

FINDINGS: Lower chest: No acute abnormality.

Hepatobiliary: No focal liver abnormality is seen. No gallstones,
gallbladder wall thickening, or biliary dilatation.

Pancreas: Unremarkable.

Spleen: Unremarkable.

Adrenals/Urinary Tract: Adrenals are unremarkable. Minimal small
bilateral nonobstructing renal calculi are present measuring up to 2
mm. Bladder is unremarkable.

Stomach/Bowel: Stomach is within normal limits. Bowel is normal in
caliber. Normal appendix.

Vascular/Lymphatic: Minor calcified plaque.  No enlarged nodes.

Reproductive: Uterus and bilateral adnexa are unremarkable.

Other: No free fluid.  Abdominal wall is unremarkable.

Musculoskeletal: No acute osseous abnormality.
IMPRESSION: Minimal punctate nonobstructing renal calculi.

No acute abnormality.

## 2024-01-13 ENCOUNTER — Telehealth: Payer: Self-pay | Admitting: Internal Medicine

## 2024-01-13 ENCOUNTER — Encounter: Payer: Self-pay | Admitting: Internal Medicine

## 2024-01-13 ENCOUNTER — Ambulatory Visit: Attending: Internal Medicine | Admitting: Internal Medicine

## 2024-01-13 VITALS — BP 120/86 | HR 76 | Ht 66.0 in | Wt 205.2 lb

## 2024-01-13 DIAGNOSIS — R079 Chest pain, unspecified: Secondary | ICD-10-CM | POA: Diagnosis not present

## 2024-01-13 DIAGNOSIS — F1010 Alcohol abuse, uncomplicated: Secondary | ICD-10-CM | POA: Diagnosis not present

## 2024-01-13 NOTE — Patient Instructions (Addendum)
 Medication Instructions:  Your physician recommends that you continue on your current medications as directed. Please refer to the Current Medication list given to you today.   Labwork: None  Testing/Procedures: Your physician has requested that you have an exercise tolerance test. For further information please visit [email or web address] Please also follow instruction sheet, as given.   Follow-Up: Your physician recommends that you schedule a follow-up appointment in: Pending Results  Any Other Special Instructions Will Be Listed Below (If Applicable). Thank you for choosing [facility]!     If you need a refill on your cardiac medications before your next appointment, please call your pharmacy.

## 2024-01-13 NOTE — Telephone Encounter (Signed)
 Checking percert on the following patient for testing scheduled at [facility].     ETT Dx: Chest Pain   01/14/2024

## 2024-01-13 NOTE — Progress Notes (Signed)
 Cardiology Office Note  Date: 01/13/2024   ID: SHYRA EMILE, DOB 1980/09/13, MRN [id number]  PCP:  Zarwolo, Gloria, FNP  Cardiologist:  Diannah SHAUNNA Maywood, MD Electrophysiologist:  None   History of Present Illness: [name] is a 43 y.o. female referred to cardiology clinic for evaluation of chest pain.  Patient has chest pains lasting for few seconds.  Sharp and stabbing chest pains.  Sometimes with rest and sometimes with exertion.  She thinks this could be from anxiety.  She has blood pressures, 140 mmHg SBP in the a.m. and p.m.  After she takes blood pressure medication in the morning, her blood pressure is better controlled.  The highest blood pressure she has seen was 160 mmHg SBP.  No other symptoms of DOE, dizziness, syncope, palpitations or leg swelling.  She is worried about exercising due to chest pains.  Past Medical History:  Diagnosis Date   Hypertension    Kidney stone     Past Surgical History:  Procedure Laterality Date   TUBAL LIGATION      Current Outpatient Medications  Medication Sig Dispense Refill   acetaminophen  (TYLENOL ) 500 MG tablet Take 500 mg by mouth every 6 (six) hours as needed for headache.     clonazePAM  (KLONOPIN ) 0.5 MG tablet Take 1 tablet (0.5 mg total) by mouth as needed for anxiety. Max:4mg /day 10 tablet 0   hydrOXYzine  (ATARAX ) 25 MG tablet Take 25 mg by mouth as needed.     olmesartan  (BENICAR ) 20 MG tablet Take 1 tablet by mouth once daily 90 tablet 0   tretinoin  (RETIN-A ) 0.025 % cream Apply topically at bedtime. 45 g 0   Vitamin D , Ergocalciferol , (DRISDOL ) 1.25 MG (50000 UNIT) CAPS capsule Take 1 capsule (50,000 Units total) by mouth every 7 (seven) days. 20 capsule 1   amLODipine  (NORVASC ) 2.5 MG tablet Take 1 tablet (2.5 mg total) by mouth daily. (Patient not taking: Reported on 01/13/2024) 30 tablet 2   No current facility-administered medications for this visit.   Allergies:  Patient has no known allergies.   Social  History: The patient  reports that she has been smoking cigarettes. She has never used smokeless tobacco. She reports current alcohol use of about 5.0 standard drinks of alcohol per week. She reports that she does not currently use drugs after having used the following drugs: Marijuana.   Family History: The patient's family history includes Breast cancer in her cousin and maternal aunt.   ROS:  Please see the history of present illness. Otherwise, complete review of systems is positive for none  All other systems are reviewed and negative.   Physical Exam: VS:  BP 120/86   Pulse 76   Ht 5' 6 (1.676 m)   Wt 205 lb 3.2 oz (93.1 kg)   SpO2 94%   BMI 33.12 kg/m , BMI Body mass index is 33.12 kg/m.  Wt Readings from Last 3 Encounters:  01/13/24 205 lb 3.2 oz (93.1 kg)  12/27/23 203 lb (92.1 kg)  11/17/23 201 lb 1.3 oz (91.2 kg)    General: Patient appears comfortable at rest. HEENT: Conjunctiva and lids normal, oropharynx clear with moist mucosa. Neck: Supple, no elevated JVP or carotid bruits, no thyromegaly. Lungs: Clear to auscultation, nonlabored breathing at rest. Cardiac: Regular rate and rhythm, no S3 or significant systolic murmur, no pericardial rub. Abdomen: Soft, nontender, no hepatomegaly, bowel sounds present, no guarding or rebound. Extremities: No pitting edema, distal pulses 2+. Skin: Warm and dry.  Musculoskeletal: No kyphosis. Neuropsychiatric: Alert and oriented x3, affect grossly appropriate.  Recent Labwork: 06/18/2023: ALT 17; AST 23; Hemoglobin 13.7; Platelets 241; TSH 2.330 11/17/2023: BUN 7; Creatinine, Ser 0.82; Magnesium 2.0; Potassium 3.8; Sodium 138     Component Value Date/Time   CHOL 189 06/18/2023 0942   TRIG 227 (H) 06/18/2023 0942   HDL 78 06/18/2023 0942   CHOLHDL 2.4 06/18/2023 0942   LDLCALC 75 06/18/2023 0942    Assessment and Plan:  Chest pain - Sharp chest pains lasting for few seconds, with rest and exertion and none lasting for more  than a minute. Likely noncardiac - Likely secondary to poorly controlled HTN - Obtain ETT to determine exercise capacity and HTN response to stress.  HTN, poorly controlled - Blood pressures range around 140 mmHg in the a.m. and p.m.  Reviewed measuring accurate blood pressure during this office visit.  Use arm cuff instead of wrist cuff. - Did not tolerate amlodipine  in the past. - Continue olmesartan  20 mg once daily. - Measure blood pressures in a.m. and p.m. - HTN management per PCP.  Alcohol abuse - Counseling provided.  40 minutes spent in reviewing the prior records, more than 3 labs, discussion of the above with the patient and documentation.     Medication Adjustments/Labs and Tests Ordered: Current medicines are reviewed at length with the patient today.  Concerns regarding medicines are outlined above.    Disposition:  Follow up pending results  Signed Meko Bellanger Priya Icis Budreau, MD, 01/13/2024 2:06 PM    [facility] [place] [address] [place], [place], [place] [address]

## 2024-01-14 ENCOUNTER — Telehealth: Payer: Self-pay | Admitting: Student

## 2024-01-14 ENCOUNTER — Ambulatory Visit (HOSPITAL_COMMUNITY)
Admission: RE | Admit: 2024-01-14 | Discharge: 2024-01-14 | Disposition: A | Discharge status: Home / Self Care | Source: Ambulatory Visit | Attending: Internal Medicine | Admitting: Internal Medicine

## 2024-01-14 DIAGNOSIS — R079 Chest pain, unspecified: Secondary | ICD-10-CM | POA: Diagnosis not present

## 2024-01-14 LAB — EXERCISE TOLERANCE TEST
Angina Index: 0
Duke Treadmill Score: 8
Estimated workload: 10.1
Exercise duration (min): 7 min
Exercise duration (sec): 30 s
MPHR: 175 {beats}/min
Peak HR: 157 {beats}/min
Percent HR: 89 %
RPE: 13
Rest HR: 81 {beats}/min
ST Depression (mm): 0 mm

## 2024-01-14 NOTE — Telephone Encounter (Signed)
     [name] presented for a treadmill stress test (GXT) today.  I Laymon CHRISTELLA Qua, PA-C, provided direct supervision and was present during the study today, which was completed without significant symptoms, immediate complications, or acute ST/T changes on ECG.  Official results are pending at this time.  Preliminary ECG findings may be listed in the chart, but the stress test result will not be finalized until read by the Cardiologist.  Laymon CHRISTELLA Qua, PA-C  01/14/2024, 12:04 PM

## 2024-01-18 ENCOUNTER — Other Ambulatory Visit: Payer: Self-pay

## 2024-01-18 ENCOUNTER — Encounter: Payer: Self-pay | Admitting: Family Medicine

## 2024-01-18 DIAGNOSIS — E559 Vitamin D deficiency, unspecified: Secondary | ICD-10-CM

## 2024-01-18 MED ORDER — VITAMIN D (ERGOCALCIFEROL) 1.25 MG (50000 UNIT) PO CAPS: 50000.0000 [IU] | ORAL_CAPSULE | ORAL | 1 refills | Status: AC

## 2024-01-19 ENCOUNTER — Other Ambulatory Visit: Payer: Self-pay | Admitting: Family Medicine

## 2024-01-19 DIAGNOSIS — I10 Essential (primary) hypertension: Secondary | ICD-10-CM

## 2024-01-21 ENCOUNTER — Ambulatory Visit: Payer: Self-pay | Admitting: Internal Medicine

## 2024-01-31 ENCOUNTER — Ambulatory Visit: Admitting: Family Medicine

## 2024-02-10 ENCOUNTER — Ambulatory Visit: Admitting: Family Medicine

## 2024-03-13 ENCOUNTER — Encounter: Payer: Self-pay | Admitting: Radiology

## 2024-04-18 ENCOUNTER — Other Ambulatory Visit: Payer: Self-pay | Admitting: Family Medicine

## 2024-04-18 DIAGNOSIS — I10 Essential (primary) hypertension: Secondary | ICD-10-CM

## 2024-06-12 ENCOUNTER — Ambulatory Visit: Admitting: Family Medicine
